# Patient Record
Sex: Female | Born: 1980 | Race: Black or African American | Hispanic: No | Marital: Married | Smoking: Former smoker
Health system: Southern US, Community
[De-identification: ages and names within clinical notes are randomized; demographics above are authoritative.]

## PROBLEM LIST (undated history)

## (undated) ENCOUNTER — Inpatient Hospital Stay (HOSPITAL_COMMUNITY): Payer: Self-pay

## (undated) DIAGNOSIS — B009 Herpesviral infection, unspecified: Secondary | ICD-10-CM

## (undated) DIAGNOSIS — I10 Essential (primary) hypertension: Secondary | ICD-10-CM

## (undated) DIAGNOSIS — E119 Type 2 diabetes mellitus without complications: Secondary | ICD-10-CM

## (undated) DIAGNOSIS — B379 Candidiasis, unspecified: Secondary | ICD-10-CM

## (undated) DIAGNOSIS — E611 Iron deficiency: Secondary | ICD-10-CM

## (undated) HISTORY — DX: Essential (primary) hypertension: I10

## (undated) HISTORY — DX: Iron deficiency: E61.1

## (undated) HISTORY — PX: WISDOM TOOTH EXTRACTION: SHX21

## (undated) HISTORY — DX: Herpesviral infection, unspecified: B00.9

## (undated) HISTORY — DX: Type 2 diabetes mellitus without complications: E11.9

## (undated) HISTORY — DX: Candidiasis, unspecified: B37.9

---

## 1999-08-20 ENCOUNTER — Emergency Department (HOSPITAL_COMMUNITY): Admission: EM | Admit: 1999-08-20 | Discharge: 1999-08-20 | Payer: Self-pay | Admitting: Internal Medicine

## 2003-03-31 ENCOUNTER — Other Ambulatory Visit: Admission: RE | Admit: 2003-03-31 | Discharge: 2003-03-31 | Payer: Self-pay | Admitting: Obstetrics and Gynecology

## 2004-04-27 ENCOUNTER — Other Ambulatory Visit: Admission: RE | Admit: 2004-04-27 | Discharge: 2004-04-27 | Payer: Self-pay | Admitting: Obstetrics and Gynecology

## 2005-05-01 ENCOUNTER — Other Ambulatory Visit: Admission: RE | Admit: 2005-05-01 | Discharge: 2005-05-01 | Payer: Self-pay | Admitting: Obstetrics and Gynecology

## 2011-11-10 DIAGNOSIS — I10 Essential (primary) hypertension: Secondary | ICD-10-CM | POA: Insufficient documentation

## 2011-11-16 ENCOUNTER — Encounter: Payer: Self-pay | Admitting: Obstetrics and Gynecology

## 2011-12-04 ENCOUNTER — Encounter: Payer: Self-pay | Admitting: Obstetrics and Gynecology

## 2011-12-04 ENCOUNTER — Ambulatory Visit (INDEPENDENT_AMBULATORY_CARE_PROVIDER_SITE_OTHER): Payer: Commercial Indemnity | Admitting: Obstetrics and Gynecology

## 2011-12-04 VITALS — BP 118/72 | HR 84 | Resp 16 | Ht 61.0 in | Wt 224.0 lb

## 2011-12-04 DIAGNOSIS — Z01419 Encounter for gynecological examination (general) (routine) without abnormal findings: Secondary | ICD-10-CM

## 2011-12-04 DIAGNOSIS — E611 Iron deficiency: Secondary | ICD-10-CM | POA: Insufficient documentation

## 2011-12-04 DIAGNOSIS — B009 Herpesviral infection, unspecified: Secondary | ICD-10-CM | POA: Insufficient documentation

## 2011-12-04 DIAGNOSIS — Z124 Encounter for screening for malignant neoplasm of cervix: Secondary | ICD-10-CM

## 2011-12-04 NOTE — Progress Notes (Signed)
ANNUAL GYNECOLOGIC EXAMINATION   Roberta Bell is a 31 y.o. female, No obstetric history on file., who presents for an annual exam. The patient has not been seen in our office in 3 years.  She has a history of hidradenitis.  She is not using contraception.  She and her husband are now on good terms. History of herpes virus 1.  No lesions.  Prior Hysterectomy: No    History   Social History  . Marital Status: Single    Spouse Name: N/A    Number of Children: N/A  . Years of Education: N/A   Social History Main Topics  . Smoking status: Former Games developer  . Smokeless tobacco: Never Used  . Alcohol Use: No  . Drug Use: No  . Sexually Active: None   Other Topics Concern  . None   Social History Narrative  . None    Menstrual cycle:   LMP: Patient's last menstrual period was 11/12/2011.             The following portions of the patient's history were reviewed and updated as appropriate: allergies, current medications, past family history, past medical history, past social history, past surgical history and problem list.  Review of Systems Pertinent items are noted in HPI. Breast:Negative for breast lump,nipple discharge or nipple retraction Gastrointestinal: Negative for abdominal pain, change in bowel habits or rectal bleeding Urinary:negative   Objective:    BP 118/72  Pulse 84  Resp 16  Ht 5\' 1"  (1.549 m)  Wt 224 lb (101.606 kg)  BMI 42.32 kg/m2  LMP 11/12/2011    Weight:  Wt Readings from Last 1 Encounters:  12/04/11 224 lb (101.606 kg)          BMI: Body mass index is 42.32 kg/(m^2).  General Appearance: Alert, appropriate appearance for age. No acute distress HEENT: Grossly normal Neck / Thyroid: Supple, no masses, nodes or enlargement Lungs: clear to auscultation bilaterally Back: No CVA tenderness Breast Exam: No masses or nodes.No dimpling, nipple retraction or discharge. Cardiovascular: Regular rate and rhythm. S1, S2, no murmur Gastrointestinal: Soft,  non-tender, no masses or organomegaly  ++++++++++++++++++++++++++++++++++++++++++++++++++++++++  Pelvic Exam: External genitalia: normal general appearance Vaginal: normal without tenderness, induration or masses. Relaxation: No Cervix: normal appearance Adnexa: normal bimanual exam Uterus: normal size, shape, and consistency Rectovaginal: normal rectal, no masses  ++++++++++++++++++++++++++++++++++++++++++++++++++++++++  Lymphatic Exam: Non-palpable nodes in neck, clavicular, axillary, or inguinal regions Neurologic: Normal speech, no tremor  Psychiatric: Alert and oriented, appropriate affect.  Assessment:    Normal gyn exam   Overweight or obese: Yes   Pelvic relaxation: No  Hidradenitis.  May want to get pregnant in the next one or 2 years.  Herpes virus 1   Plan:    pap smear return annually or prn Contraception:no method    Medications prescribed: none  STD screen request: No   The updated Pap smear screening guidelines were discussed with the patient. The patient requested that I obtain a Pap smear: Yes.  Kegel exercises discussed: No.  Proper diet and regular exercise were reviewed.  Annual mammograms recommended starting at age 51. Proper breast care was discussed.  Screening colonoscopy is recommended beginning at age 64.  Regular health maintenance was reviewed.  Sleep hygiene was discussed.  Adequate calcium and vitamin D intake was emphasized.  Leonard Schwartz M.D.    Regular Periods: no Mammogram: no  Monthly Breast Ex.: yes Exercise: yes  Tetanus < 10 years: no Seatbelts: yes  NI. Bladder  Functn.: yes Abuse at home: no  Daily BM's: yes Stressful Work: yes  Healthy Diet: yes Sigmoid-Colonoscopy: "N/A"  Calcium: no Medical problems this year: pt was placed on B/P medication.    LAST PAP:06/10/2008 "WNL"  Contraception: None  Mammogram:  Never  PCP: Posey Pronto, MD   PMH: None  FMH: None   Last Bone Scan:  Never

## 2011-12-05 LAB — PAP IG W/ RFLX HPV ASCU

## 2013-11-18 ENCOUNTER — Other Ambulatory Visit (HOSPITAL_COMMUNITY): Payer: Self-pay | Admitting: Obstetrics and Gynecology

## 2013-11-18 DIAGNOSIS — N978 Female infertility of other origin: Secondary | ICD-10-CM

## 2013-11-21 ENCOUNTER — Ambulatory Visit (HOSPITAL_COMMUNITY)
Admission: RE | Admit: 2013-11-21 | Discharge: 2013-11-21 | Disposition: A | Discharge status: Home / Self Care | Payer: 59 | Source: Ambulatory Visit | Attending: Obstetrics and Gynecology | Admitting: Obstetrics and Gynecology

## 2013-11-21 DIAGNOSIS — N978 Female infertility of other origin: Secondary | ICD-10-CM

## 2013-11-21 DIAGNOSIS — N979 Female infertility, unspecified: Secondary | ICD-10-CM | POA: Insufficient documentation

## 2013-11-21 MED ORDER — IOHEXOL 300 MG/ML  SOLN
20.0000 mL | Freq: Once | INTRAMUSCULAR | Status: AC | PRN
  Administered 2013-11-21: 20 mL

## 2014-10-29 ENCOUNTER — Inpatient Hospital Stay (HOSPITAL_COMMUNITY): Payer: BLUE CROSS/BLUE SHIELD

## 2014-10-29 ENCOUNTER — Inpatient Hospital Stay (HOSPITAL_COMMUNITY)
Admission: AD | Admit: 2014-10-29 | Discharge: 2014-10-29 | Disposition: A | Discharge status: Home / Self Care | Payer: BLUE CROSS/BLUE SHIELD | Source: Ambulatory Visit | Attending: Obstetrics and Gynecology | Admitting: Obstetrics and Gynecology

## 2014-10-29 DIAGNOSIS — Z3A01 Less than 8 weeks gestation of pregnancy: Secondary | ICD-10-CM | POA: Diagnosis not present

## 2014-10-29 DIAGNOSIS — O209 Hemorrhage in early pregnancy, unspecified: Secondary | ICD-10-CM | POA: Diagnosis not present

## 2014-10-29 DIAGNOSIS — Z87891 Personal history of nicotine dependence: Secondary | ICD-10-CM | POA: Diagnosis not present

## 2014-10-29 LAB — URINALYSIS, ROUTINE W REFLEX MICROSCOPIC
Bilirubin Urine: NEGATIVE
Glucose, UA: NEGATIVE mg/dL
Ketones, ur: NEGATIVE mg/dL
Nitrite: NEGATIVE
Protein, ur: NEGATIVE mg/dL
Specific Gravity, Urine: 1.01 (ref 1.005–1.030)
Urobilinogen, UA: 0.2 mg/dL (ref 0.0–1.0)
pH: 7.5 (ref 5.0–8.0)

## 2014-10-29 LAB — CBC WITH DIFFERENTIAL/PLATELET
Basophils Absolute: 0 10*3/uL (ref 0.0–0.1)
Basophils Relative: 0 % (ref 0–1)
EOS PCT: 2 % (ref 0–5)
Eosinophils Absolute: 0.3 10*3/uL (ref 0.0–0.7)
HEMATOCRIT: 36.6 % (ref 36.0–46.0)
Hemoglobin: 11.3 g/dL — ABNORMAL LOW (ref 12.0–15.0)
LYMPHS ABS: 2 10*3/uL (ref 0.7–4.0)
LYMPHS PCT: 19 % (ref 12–46)
MCH: 22.1 pg — ABNORMAL LOW (ref 26.0–34.0)
MCHC: 30.9 g/dL (ref 30.0–36.0)
MCV: 71.6 fL — ABNORMAL LOW (ref 78.0–100.0)
MONO ABS: 0.6 10*3/uL (ref 0.1–1.0)
Monocytes Relative: 6 % (ref 3–12)
NEUTROS ABS: 7.6 10*3/uL (ref 1.7–7.7)
Neutrophils Relative %: 73 % (ref 43–77)
Platelets: 261 10*3/uL (ref 150–400)
RBC: 5.11 MIL/uL (ref 3.87–5.11)
RDW: 15.8 % — ABNORMAL HIGH (ref 11.5–15.5)
WBC: 10.5 10*3/uL (ref 4.0–10.5)

## 2014-10-29 LAB — URINE MICROSCOPIC-ADD ON

## 2014-10-29 LAB — HCG, QUANTITATIVE, PREGNANCY: hCG, Beta Chain, Quant, S: 1284 m[IU]/mL — ABNORMAL HIGH (ref <5)

## 2014-10-29 LAB — ABO/RH: ABO/RH(D): A POS

## 2014-10-29 LAB — POCT PREGNANCY, URINE: PREG TEST UR: POSITIVE — AB

## 2014-10-29 NOTE — MAU Note (Signed)
Pt presents complaining of vaginal bleeding and lower abdominal cramping like period cramping. Pt states she is here for an ultrasound. Infertility pt. Bleeding is less now and only when wiping.

## 2014-10-29 NOTE — MAU Provider Note (Signed)
Roberta Bell is a 34 y.o. G1P0 at 5.0 weeks a GN infertility pt with confirmed pregnancy called today with VB.  Quant on 10/27/14 1289.0   History     Patient Active Problem List   Diagnosis Date Noted  . HSV-1 infection   . Low iron     Chief Complaint  Patient presents with  . Vaginal Bleeding   HPI  OB History    No data available      Past Medical History  Diagnosis Date  . HSV-1 infection   . Yeast infection   . Hypertension   . Low iron     Past Surgical History  Procedure Laterality Date  . Wisdom tooth extraction      Family History  Problem Relation Age of Onset  . Diabetes Mother   . Hypertension Father   . Diabetes Father     Social History  Substance Use Topics  . Smoking status: Former Games developer  . Smokeless tobacco: Never Used  . Alcohol Use: No    Allergies: Not on File  Prescriptions prior to admission  Medication Sig Dispense Refill Last Dose  . lisinopril (PRINIVIL,ZESTRIL) 10 MG tablet Take 10 mg by mouth daily.   Taking  . valACYclovir (VALTREX) 500 MG tablet Take 500 mg by mouth 2 (two) times daily.   Taking    ROS See HPI above, all other systems are negative  Physical Exam   Last menstrual period 09/20/2014.  Physical Exam Ext:  WNL ABD: Soft, non tender to palpation, no rebound or guarding SVE: deferred    ED Course  Assessment: VB   Plan: CBC, Quant, [place] for viability  Jayme Mednick, CNM, MSN 10/29/2014. 12:50 PM

## 2014-10-29 NOTE — Discharge Instructions (Signed)

## 2014-10-29 NOTE — H&P (Signed)
Admission History and Physical Exam for a Gynecology Patient  [name] is a 34 y.o. female, No obstetric history on file., who presents for evaluation of first trimester bleeding. She has been followed at the [facility] and Gynecology division of [facility]. The patient is 5.[redacted] weeks pregnant. She conceived this pregnancy with the use of clomiphene. She reports having vaginal bleeding this morning. She denies pain. Her quantitative hCG on 10/27/2014 was 1289.  OB History    No data available      Past Medical History  Diagnosis Date  . HSV-1 infection   . Yeast infection   . Hypertension   . Low iron     Prescriptions prior to admission  Medication Sig Dispense Refill Last Dose  . hydrochlorothiazide (HYDRODIURIL) 25 MG tablet Take 25 mg by mouth at bedtime.   10/28/2014 at Unknown time  . methyldopa (ALDOMET) 250 MG tablet Take 250 mg by mouth 2 (two) times daily.   10/29/2014 at Unknown time  . valACYclovir (VALTREX) 500 MG tablet Take 500 mg by mouth 2 (two) times daily.   Taking    Past Surgical History  Procedure Laterality Date  . Wisdom tooth extraction      No Known Allergies  Family History: family history includes Diabetes in her father and mother; Hypertension in her father.  Social History:  reports that she has quit smoking. She has never used smokeless tobacco. She reports that she does not drink alcohol or use illicit drugs.  Review of systems: See HPI.  Admission Physical Exam:    BMI = 44.  Last menstrual period 09/20/2014.  HEENT:                 Within normal limits Chest:                   Clear Heart:                    Regular rate and rhythm Breasts:                No masses, skin changes, bleeding, or discharge present Abdomen:             Nontender, no masses Extremities:          Grossly normal Neurologic exam: Grossly normal  Pelvic exam:  External genitalia: normal general appearance Vaginal:  Small amount of dark blood in the vaginal vault Cervix: Closed, nontender Adnexa: normal bimanual exam Uterus: Normal size, nontender (difficult to evaluate because of increased BMI)   Results for orders placed or performed during the hospital encounter of 10/29/14 (from the past 24 hour(s))  Urinalysis, Routine w reflex microscopic (not at [facility])     Status: Abnormal   Collection Time: 10/29/14 12:30 PM  Result Value Ref Range   Color, Urine YELLOW YELLOW   APPearance CLEAR CLEAR   Specific Gravity, Urine 1.010 1.005 - 1.030   pH 7.5 5.0 - 8.0   Glucose, UA NEGATIVE NEGATIVE mg/dL   Hgb urine dipstick LARGE (A) NEGATIVE   Bilirubin Urine NEGATIVE NEGATIVE   Ketones, ur NEGATIVE NEGATIVE mg/dL   Protein, ur NEGATIVE NEGATIVE mg/dL   Urobilinogen, UA 0.2 0.0 - 1.0 mg/dL   Nitrite NEGATIVE NEGATIVE   Leukocytes, UA SMALL (A) NEGATIVE  Urine microscopic-add on     Status: Abnormal   Collection Time: 10/29/14 12:30 PM  Result Value Ref Range   Squamous Epithelial / LPF FEW (A)  RARE   WBC, UA 0-2 <3 WBC/hpf  CBC with Differential/Platelet     Status: Abnormal   Collection Time: 10/29/14  1:07 PM  Result Value Ref Range   WBC 10.5 4.0 - 10.5 K/uL   RBC 5.11 3.87 - 5.11 MIL/uL   Hemoglobin 11.3 (L) 12.0 - 15.0 g/dL   HCT 19.1 47.8 - 29.5 %   MCV 71.6 (L) 78.0 - 100.0 fL   MCH 22.1 (L) 26.0 - 34.0 pg   MCHC 30.9 30.0 - 36.0 g/dL   RDW 62.1 (H) 30.8 - 65.7 %   Platelets 261 150 - 400 K/uL   Neutrophils Relative % 73 43 - 77 %   Neutro Abs 7.6 1.7 - 7.7 K/uL   Lymphocytes Relative 19 12 - 46 %   Lymphs Abs 2.0 0.7 - 4.0 K/uL   Monocytes Relative 6 3 - 12 %   Monocytes Absolute 0.6 0.1 - 1.0 K/uL   Eosinophils Relative 2 0 - 5 %   Eosinophils Absolute 0.3 0.0 - 0.7 K/uL   Basophils Relative 0 0 - 1 %   Basophils Absolute 0.0 0.0 - 0.1 K/uL  hCG, quantitative, pregnancy     Status: Abnormal   Collection Time: 10/29/14  1:07 PM  Result Value Ref Range   hCG, Beta Chain, Quant, S  1284 (H) <5 mIU/mL  Pregnancy, urine POC     Status: Abnormal   Collection Time: 10/29/14  1:36 PM  Result Value Ref Range   Preg Test, Ur POSITIVE (A) NEGATIVE   Ultrasound: No intra-uterine pregnancy is visualized. The endometrial complex measures 11 mm. The left ovary is within normal limits. Fibroids are noted with a 6.0 cm intramural posterior fibroid being dominant. The right ovary and adnexa are not visualized. No free fluid is seen in the cul-de-sac.  Assessment:  5 week 4 day gestation  First trimester bleeding  Fibroid uterus  Hypertension  Infertility  Obesity  Anemia  Plan:  Management of first trimester bleeding was reviewed. Options discussed. Risks and and benefits outlined.  The patient will repeat her quantitative hCG on 10/31/2014. She will remain nothing by mouth because she understands that a D&C may be required or even laparoscopy if an ectopic pregnancy is suspected.  Ectopic precautions were given. The patient will call if her bleeding increases or she has severe pain.  Blood type and Rh are pending.  GC and chlamydia was sent.   Janine Limbo 10/29/2014

## 2014-10-30 LAB — GC/CHLAMYDIA PROBE AMP (~~LOC~~) NOT AT ARMC
Chlamydia: NEGATIVE
Neisseria Gonorrhea: NEGATIVE

## 2014-10-31 ENCOUNTER — Encounter (HOSPITAL_COMMUNITY): Payer: Self-pay | Admitting: *Deleted

## 2014-10-31 ENCOUNTER — Inpatient Hospital Stay (HOSPITAL_COMMUNITY)
Admission: AD | Admit: 2014-10-31 | Discharge: 2014-10-31 | Disposition: A | Discharge status: Home / Self Care | Payer: BLUE CROSS/BLUE SHIELD | Source: Ambulatory Visit | Attending: Obstetrics and Gynecology | Admitting: Obstetrics and Gynecology

## 2014-10-31 DIAGNOSIS — Z87891 Personal history of nicotine dependence: Secondary | ICD-10-CM | POA: Diagnosis not present

## 2014-10-31 DIAGNOSIS — O10919 Unspecified pre-existing hypertension complicating pregnancy, unspecified trimester: Secondary | ICD-10-CM | POA: Insufficient documentation

## 2014-10-31 DIAGNOSIS — Z3A00 Weeks of gestation of pregnancy not specified: Secondary | ICD-10-CM | POA: Insufficient documentation

## 2014-10-31 DIAGNOSIS — O200 Threatened abortion: Secondary | ICD-10-CM | POA: Insufficient documentation

## 2014-10-31 DIAGNOSIS — O039 Complete or unspecified spontaneous abortion without complication: Secondary | ICD-10-CM

## 2014-10-31 LAB — HCG, QUANTITATIVE, PREGNANCY: hCG, Beta Chain, Quant, S: 1247 m[IU]/mL — ABNORMAL HIGH (ref <5)

## 2014-10-31 NOTE — Progress Notes (Signed)
Notified lab results are back, will come see pt.

## 2014-10-31 NOTE — MAU Provider Note (Addendum)
  History   34 yo G1P0 here for f/u [facility] s/p evaluation in MAU on 8/11 by Dr. Stefano Gaul for vaginal bleeding at 5 4/[redacted] weeks gestation--QHCG that day 1284, normal Hgb.  US showed no IUP, 6 cm intramural fibroid, normal left ovary, right ovary unable to be visualized.  Patient conceived on Clomid.  Denies pain.  Still has small amount spotting.    Patient Active Problem List   Diagnosis Date Noted  . Miscarriage 10/31/14 10/31/2014  . First trimester bleeding 10/29/2014  . HSV-1 infection   . Low iron     Chief Complaint  Patient presents with  . Follow-up   HPI:  As above  OB History    Gravida Para Term Preterm AB TAB SAB Ectopic Multiple Living   1               Past Medical History  Diagnosis Date  . HSV-1 infection   . Yeast infection   . Hypertension   . Low iron     Past Surgical History  Procedure Laterality Date  . Wisdom tooth extraction      Family History  Problem Relation Age of Onset  . Diabetes Mother   . Hypertension Father   . Diabetes Father     Social History  Substance Use Topics  . Smoking status: Former Games developer  . Smokeless tobacco: Never Used  . Alcohol Use: No    Allergies: No Known Allergies  Prescriptions prior to admission  Medication Sig Dispense Refill Last Dose  . hydrochlorothiazide (HYDRODIURIL) 25 MG tablet Take 25 mg by mouth at bedtime.   10/28/2014 at Unknown time  . methyldopa (ALDOMET) 250 MG tablet Take 250 mg by mouth 2 (two) times daily.   10/29/2014 at Unknown time  . Prenatal Vit-Fe Fumarate-FA (PRENATAL MULTIVITAMIN) TABS tablet Take 1 tablet by mouth daily at 12 noon.   10/29/2014 at Unknown time    ROS:  Small amount BRB Physical Exam   Blood pressure 128/76, pulse 89, temperature 98.7 F (37.1 C), resp. rate 18, height  (1.549 m), weight 104.327 kg (230 lb), last menstrual period 09/20/2014.    Physical Exam  In NAD PE deferred  Results for orders placed or performed during the hospital encounter of  10/31/14 (from the past 24 hour(s))  hCG, quantitative, pregnancy     Status: Abnormal   Collection Time: 10/31/14  7:15 AM  Result Value Ref Range   hCG, Beta Chain, Quant, S 1247 (H) <5 mIU/mL    ED Course  Assessment: Inevitable miscarriage A+ Chronic hypertension Conception on Clomid  Plan: Consulted with Dr. Norberto Sorenson and f/u [facility] recommended at present. Plan repeat [facility] later this week--patient will come to outpatient lab at [facility] on Thursday, 11/04/14, in the evening for STAT QHCG. (unable to come on Wed due to work schedule). Will keep appt scheduled for Friday, 11/05/14 at [facility]--will have provider see patient for f/u on [facility] and status. Further options for management of SAB reviewed, including continuing observation until [facility] < 5, medical management with [employer], or D&E. Support to patient and husband for loss.   Nigel Bridgeman CNM, MSN 10/31/2014 10:10 AM

## 2014-10-31 NOTE — MAU Note (Signed)
Here for f/u BHCG. Denies any pain. Bright red blood on tissue only when wipes but states that is the same as on Thurs.

## 2014-11-05 ENCOUNTER — Other Ambulatory Visit (HOSPITAL_COMMUNITY)
Admission: RE | Admit: 2014-11-05 | Discharge: 2014-11-05 | Disposition: A | Discharge status: Home / Self Care | Payer: BLUE CROSS/BLUE SHIELD | Source: Ambulatory Visit

## 2014-11-05 ENCOUNTER — Inpatient Hospital Stay (HOSPITAL_COMMUNITY)
Admission: AD | Admit: 2014-11-05 | Discharge: 2014-11-05 | Disposition: A | Discharge status: Home / Self Care | Payer: BLUE CROSS/BLUE SHIELD | Source: Ambulatory Visit | Attending: Obstetrics and Gynecology | Admitting: Obstetrics and Gynecology

## 2014-11-05 DIAGNOSIS — O200 Threatened abortion: Secondary | ICD-10-CM | POA: Diagnosis present

## 2014-11-05 DIAGNOSIS — Z3A00 Weeks of gestation of pregnancy not specified: Secondary | ICD-10-CM | POA: Diagnosis not present

## 2014-11-05 LAB — HCG, QUANTITATIVE, PREGNANCY: hCG, Beta Chain, Quant, S: 686 m[IU]/mL — ABNORMAL HIGH (ref <5)

## 2014-11-19 ENCOUNTER — Inpatient Hospital Stay (HOSPITAL_COMMUNITY)
Admission: AD | Admit: 2014-11-19 | Discharge: 2014-11-19 | Disposition: A | Discharge status: Home / Self Care | Payer: BLUE CROSS/BLUE SHIELD | Source: Ambulatory Visit | Attending: Obstetrics and Gynecology | Admitting: Obstetrics and Gynecology

## 2014-11-19 DIAGNOSIS — Z87891 Personal history of nicotine dependence: Secondary | ICD-10-CM | POA: Insufficient documentation

## 2014-11-19 DIAGNOSIS — O039 Complete or unspecified spontaneous abortion without complication: Secondary | ICD-10-CM | POA: Insufficient documentation

## 2014-11-19 DIAGNOSIS — Z3A00 Weeks of gestation of pregnancy not specified: Secondary | ICD-10-CM | POA: Insufficient documentation

## 2014-11-19 LAB — CBC WITH DIFFERENTIAL/PLATELET
BASOS ABS: 0 10*3/uL (ref 0.0–0.1)
Basophils Relative: 0 % (ref 0–1)
Eosinophils Absolute: 0.2 10*3/uL (ref 0.0–0.7)
Eosinophils Relative: 2 % (ref 0–5)
HEMATOCRIT: 35 % — AB (ref 36.0–46.0)
Hemoglobin: 10.7 g/dL — ABNORMAL LOW (ref 12.0–15.0)
LYMPHS PCT: 21 % (ref 12–46)
Lymphs Abs: 1.9 10*3/uL (ref 0.7–4.0)
MCH: 21.9 pg — AB (ref 26.0–34.0)
MCHC: 30.6 g/dL (ref 30.0–36.0)
MCV: 71.7 fL — AB (ref 78.0–100.0)
MONO ABS: 0.5 10*3/uL (ref 0.1–1.0)
Monocytes Relative: 6 % (ref 3–12)
NEUTROS ABS: 6.3 10*3/uL (ref 1.7–7.7)
Neutrophils Relative %: 71 % (ref 43–77)
Platelets: 225 10*3/uL (ref 150–400)
RBC: 4.88 MIL/uL (ref 3.87–5.11)
RDW: 15.4 % (ref 11.5–15.5)
WBC: 8.9 10*3/uL (ref 4.0–10.5)

## 2014-11-19 LAB — AST: AST: 43 U/L — ABNORMAL HIGH (ref 15–41)

## 2014-11-19 LAB — CREATININE, SERUM
CREATININE: 0.67 mg/dL (ref 0.44–1.00)
GFR calc Af Amer: >60 mL/min (ref >60)

## 2014-11-19 LAB — BUN: BUN: 12 mg/dL (ref 6–20)

## 2014-11-19 MED ORDER — METHOTREXATE INJECTION FOR WOMEN'S HOSPITAL
50.0000 mg/m2 | Freq: Once | INTRAMUSCULAR | Status: AC
  Administered 2014-11-19: 110 mg via INTRAMUSCULAR
  Filled 2014-11-19: qty 2.2

## 2014-11-19 NOTE — MAU Note (Signed)
Pt sent in for an"injection for her miscarriage.

## 2014-11-19 NOTE — MAU Provider Note (Signed)
Admission History and Physical Exam for a Gynecology Patient  Ms. Sarajane Fambrough is a 34 y.o. female, G1P0, who presents for methotrexate because of a nonviable first trimester pregnancy. The patient has had multiple quantitative hCG values.  10/27/2014  1289; 10/31/2014  1247; 11/10/2014  743; and 11/18/2014  1042.    An ultrasound shows a fibroid uterus but no definite intrauterine gestation. No adnexal masses appreciated. The patient is Rh+. She has been followed at the [facility] and Gynecology division of [facility].  OB History    Gravida Para Term Preterm AB TAB SAB Ectopic Multiple Living   1               Past Medical History  Diagnosis Date  . HSV-1 infection   . Yeast infection   . Hypertension   . Low iron     Prescriptions prior to admission  Medication Sig Dispense Refill Last Dose  . hydrochlorothiazide (HYDRODIURIL) 25 MG tablet Take 25 mg by mouth at bedtime.   11/18/2014 at Unknown time  . methyldopa (ALDOMET) 250 MG tablet Take 250 mg by mouth 2 (two) times daily.   11/19/2014 at Unknown time  . Prenatal Vit-Fe Fumarate-FA (PRENATAL MULTIVITAMIN) TABS tablet Take 1 tablet by mouth daily at 12 noon.   Past Week at Unknown time    Past Surgical History  Procedure Laterality Date  . Wisdom tooth extraction      No Known Allergies  Family History: family history includes Diabetes in her father and mother; Hypertension in her father.  Social History:  reports that she has quit smoking. She has never used smokeless tobacco. She reports that she does not drink alcohol or use illicit drugs.  Review of systems: See HPI.  Admission Physical Exam:    Body mass index is 44.58 kg/(m^2).  Blood pressure 116/70, pulse 90, temperature 99.1 F (37.3 C), resp. rate 18, height  (1.549 m), weight 235 lb 12.8 oz (106.958 kg), last menstrual period 09/20/2014.  HEENT:                 Within normal limits Chest:                    Clear Heart:                    Regular rate and rhythm Breasts:                No masses, skin changes, bleeding, or discharge present Abdomen:             Nontender, no masses Extremities:          Grossly normal Neurologic exam: Grossly normal  Pelvic exam:  Exam deferred.  Assessment:  Nonviable first trimester gestation  Abnormal hCG values  Plan:  Methotrexate per protocol.  Repeat quantitative hCG on day 4 and day 7.  Ectopic precautions given.   Janine Limbo 11/19/2014

## 2014-11-23 ENCOUNTER — Other Ambulatory Visit: Payer: Self-pay | Admitting: Obstetrics and Gynecology

## 2014-11-23 ENCOUNTER — Inpatient Hospital Stay (HOSPITAL_COMMUNITY)
Admission: AD | Admit: 2014-11-23 | Discharge: 2014-11-23 | Disposition: A | Discharge status: Home / Self Care | Payer: BLUE CROSS/BLUE SHIELD | Source: Ambulatory Visit | Attending: Obstetrics and Gynecology | Admitting: Obstetrics and Gynecology

## 2014-11-23 DIAGNOSIS — O009 Ectopic pregnancy, unspecified: Secondary | ICD-10-CM | POA: Diagnosis present

## 2014-11-23 LAB — HCG, QUANTITATIVE, PREGNANCY: HCG, BETA CHAIN, QUANT, S: 1169 m[IU]/mL — AB (ref <5)

## 2014-11-23 NOTE — MAU Provider Note (Signed)
Here for day 4 repeat QHCG from MTX dose on 11/19/14. Small amount spotting, no pain.  Has difficulty getting to office during office hrs.  QHCGs: 10/27/2014  1289 10/29/2014  1284 10/31/2014  1247 11/05/14    686 11/10/2014    743 11/18/2014  1042.  RECEIVED MTX 11/19/14 DAY 4 11/23/14 1169  [place] on 10/29/14: Ultrasound: No intra-uterine pregnancy is visualized. The endometrial complex measures 11 mm. The left ovary is within normal limits. Fibroids are noted with a 6.0 cm intramural posterior fibroid being dominant. The right ovary and adnexa are not visualized. No free fluid is seen in the cul-de-sac.   Will return to MAU on day 7 (11/26/14) for repeat STAT QHCG, to be called to [facility] CNM when available.  Patient to expect a call that night or the next am. [facility] order placed in "sign and hold". Patient may leave hospital after lab draw.  Phone number [phone]. She understands she may have to return for repeat MTX if results do not decrease by at least 15%. Ectopic precautions reviewed.  Nigel Bridgeman, CNM 11/23/14 3:30p

## 2014-11-23 NOTE — MAU Note (Signed)
Doing ok pain wise, had cramping a couple days ago.  Is bleeding, a little heavier than her normal cycle.

## 2014-11-23 NOTE — MAU Note (Signed)
Methotrexate follow up, day 4

## 2014-11-26 ENCOUNTER — Inpatient Hospital Stay (HOSPITAL_COMMUNITY)
Admission: AD | Admit: 2014-11-26 | Discharge: 2014-11-26 | Discharge status: Absent without leave | Payer: BLUE CROSS/BLUE SHIELD | Source: Home / Self Care | Attending: Obstetrics and Gynecology | Admitting: Obstetrics and Gynecology

## 2014-11-26 ENCOUNTER — Other Ambulatory Visit (HOSPITAL_COMMUNITY)
Admission: RE | Admit: 2014-11-26 | Discharge: 2014-11-26 | Disposition: A | Discharge status: Home / Self Care | Payer: BLUE CROSS/BLUE SHIELD | Source: Ambulatory Visit | Attending: Obstetrics and Gynecology | Admitting: Obstetrics and Gynecology

## 2014-11-26 DIAGNOSIS — O009 Ectopic pregnancy, unspecified: Secondary | ICD-10-CM | POA: Diagnosis present

## 2014-11-26 LAB — HCG, QUANTITATIVE, PREGNANCY: hCG, Beta Chain, Quant, S: 878 m[IU]/mL — ABNORMAL HIGH (ref <5)

## 2015-04-02 ENCOUNTER — Other Ambulatory Visit (HOSPITAL_COMMUNITY): Payer: Self-pay | Admitting: Obstetrics and Gynecology

## 2015-04-02 ENCOUNTER — Other Ambulatory Visit: Payer: Self-pay | Admitting: Obstetrics and Gynecology

## 2015-04-02 DIAGNOSIS — N979 Female infertility, unspecified: Secondary | ICD-10-CM

## 2015-04-07 ENCOUNTER — Ambulatory Visit (HOSPITAL_COMMUNITY)
Admission: RE | Admit: 2015-04-07 | Discharge: 2015-04-07 | Disposition: A | Discharge status: Home / Self Care | Payer: BLUE CROSS/BLUE SHIELD | Source: Ambulatory Visit | Attending: Obstetrics and Gynecology | Admitting: Obstetrics and Gynecology

## 2015-04-07 ENCOUNTER — Encounter (HOSPITAL_COMMUNITY): Payer: Self-pay

## 2015-04-07 DIAGNOSIS — N979 Female infertility, unspecified: Secondary | ICD-10-CM

## 2015-04-07 MED ORDER — IOHEXOL 300 MG/ML  SOLN
30.0000 mL | Freq: Once | INTRAMUSCULAR | Status: AC | PRN
  Administered 2015-04-07: 30 mL

## 2015-06-25 DIAGNOSIS — L309 Dermatitis, unspecified: Secondary | ICD-10-CM | POA: Diagnosis not present

## 2015-07-06 DIAGNOSIS — N979 Female infertility, unspecified: Secondary | ICD-10-CM | POA: Diagnosis not present

## 2015-07-13 DIAGNOSIS — N979 Female infertility, unspecified: Secondary | ICD-10-CM | POA: Diagnosis not present

## 2015-08-02 DIAGNOSIS — N979 Female infertility, unspecified: Secondary | ICD-10-CM | POA: Diagnosis not present

## 2015-08-10 DIAGNOSIS — N979 Female infertility, unspecified: Secondary | ICD-10-CM | POA: Diagnosis not present

## 2015-08-10 DIAGNOSIS — D259 Leiomyoma of uterus, unspecified: Secondary | ICD-10-CM | POA: Diagnosis not present

## 2015-08-30 DIAGNOSIS — N979 Female infertility, unspecified: Secondary | ICD-10-CM | POA: Diagnosis not present

## 2015-09-06 DIAGNOSIS — N979 Female infertility, unspecified: Secondary | ICD-10-CM | POA: Diagnosis not present

## 2015-09-14 DIAGNOSIS — N979 Female infertility, unspecified: Secondary | ICD-10-CM | POA: Diagnosis not present

## 2015-09-14 DIAGNOSIS — Z0000 Encounter for general adult medical examination without abnormal findings: Secondary | ICD-10-CM | POA: Diagnosis not present

## 2015-09-14 DIAGNOSIS — Z79899 Other long term (current) drug therapy: Secondary | ICD-10-CM | POA: Diagnosis not present

## 2015-09-14 DIAGNOSIS — I10 Essential (primary) hypertension: Secondary | ICD-10-CM | POA: Diagnosis not present

## 2015-09-14 DIAGNOSIS — R740 Nonspecific elevation of levels of transaminase and lactic acid dehydrogenase [LDH]: Secondary | ICD-10-CM | POA: Diagnosis not present

## 2015-09-14 DIAGNOSIS — E559 Vitamin D deficiency, unspecified: Secondary | ICD-10-CM | POA: Diagnosis not present

## 2015-09-17 ENCOUNTER — Other Ambulatory Visit: Payer: Self-pay | Admitting: Family Medicine

## 2015-09-17 DIAGNOSIS — R740 Nonspecific elevation of levels of transaminase and lactic acid dehydrogenase [LDH]: Principal | ICD-10-CM

## 2015-09-17 DIAGNOSIS — R7401 Elevation of levels of liver transaminase levels: Secondary | ICD-10-CM

## 2015-09-27 DIAGNOSIS — N979 Female infertility, unspecified: Secondary | ICD-10-CM | POA: Diagnosis not present

## 2015-09-28 ENCOUNTER — Ambulatory Visit
Admission: RE | Admit: 2015-09-28 | Discharge: 2015-09-28 | Disposition: A | Discharge status: Home / Self Care | Payer: BLUE CROSS/BLUE SHIELD | Source: Ambulatory Visit | Attending: Family Medicine | Admitting: Family Medicine

## 2015-09-28 DIAGNOSIS — R740 Nonspecific elevation of levels of transaminase and lactic acid dehydrogenase [LDH]: Principal | ICD-10-CM

## 2015-09-28 DIAGNOSIS — R7401 Elevation of levels of liver transaminase levels: Secondary | ICD-10-CM

## 2015-10-04 DIAGNOSIS — N979 Female infertility, unspecified: Secondary | ICD-10-CM | POA: Diagnosis not present

## 2015-10-05 DIAGNOSIS — R740 Nonspecific elevation of levels of transaminase and lactic acid dehydrogenase [LDH]: Secondary | ICD-10-CM | POA: Diagnosis not present

## 2015-10-25 DIAGNOSIS — N979 Female infertility, unspecified: Secondary | ICD-10-CM | POA: Diagnosis not present

## 2015-10-25 DIAGNOSIS — Z6841 Body Mass Index (BMI) 40.0 and over, adult: Secondary | ICD-10-CM | POA: Diagnosis not present

## 2015-10-25 DIAGNOSIS — R748 Abnormal levels of other serum enzymes: Secondary | ICD-10-CM | POA: Diagnosis not present

## 2015-11-25 DIAGNOSIS — N970 Female infertility associated with anovulation: Secondary | ICD-10-CM | POA: Diagnosis not present

## 2015-11-25 DIAGNOSIS — N979 Female infertility, unspecified: Secondary | ICD-10-CM | POA: Diagnosis not present

## 2015-12-21 DIAGNOSIS — N979 Female infertility, unspecified: Secondary | ICD-10-CM | POA: Diagnosis not present

## 2015-12-27 DIAGNOSIS — N979 Female infertility, unspecified: Secondary | ICD-10-CM | POA: Diagnosis not present

## 2016-01-24 DIAGNOSIS — N979 Female infertility, unspecified: Secondary | ICD-10-CM | POA: Diagnosis not present

## 2016-02-23 DIAGNOSIS — N979 Female infertility, unspecified: Secondary | ICD-10-CM | POA: Diagnosis not present

## 2016-02-23 DIAGNOSIS — L282 Other prurigo: Secondary | ICD-10-CM | POA: Diagnosis not present

## 2016-03-02 DIAGNOSIS — J069 Acute upper respiratory infection, unspecified: Secondary | ICD-10-CM | POA: Diagnosis not present

## 2016-04-11 DIAGNOSIS — Z6841 Body Mass Index (BMI) 40.0 and over, adult: Secondary | ICD-10-CM | POA: Diagnosis not present

## 2016-04-11 DIAGNOSIS — N979 Female infertility, unspecified: Secondary | ICD-10-CM | POA: Diagnosis not present

## 2016-04-11 DIAGNOSIS — N644 Mastodynia: Secondary | ICD-10-CM | POA: Diagnosis not present

## 2016-04-11 DIAGNOSIS — Z01411 Encounter for gynecological examination (general) (routine) with abnormal findings: Secondary | ICD-10-CM | POA: Diagnosis not present

## 2016-04-11 DIAGNOSIS — Z124 Encounter for screening for malignant neoplasm of cervix: Secondary | ICD-10-CM | POA: Diagnosis not present

## 2016-04-18 DIAGNOSIS — N979 Female infertility, unspecified: Secondary | ICD-10-CM | POA: Diagnosis not present

## 2016-05-23 DIAGNOSIS — N912 Amenorrhea, unspecified: Secondary | ICD-10-CM | POA: Diagnosis not present

## 2016-05-23 DIAGNOSIS — R300 Dysuria: Secondary | ICD-10-CM | POA: Diagnosis not present

## 2016-05-23 DIAGNOSIS — N979 Female infertility, unspecified: Secondary | ICD-10-CM | POA: Diagnosis not present

## 2016-06-13 DIAGNOSIS — N979 Female infertility, unspecified: Secondary | ICD-10-CM | POA: Diagnosis not present

## 2016-06-29 DIAGNOSIS — N979 Female infertility, unspecified: Secondary | ICD-10-CM | POA: Diagnosis not present

## 2016-06-29 DIAGNOSIS — N970 Female infertility associated with anovulation: Secondary | ICD-10-CM | POA: Diagnosis not present

## 2016-07-12 DIAGNOSIS — N979 Female infertility, unspecified: Secondary | ICD-10-CM | POA: Diagnosis not present

## 2016-08-09 DIAGNOSIS — N979 Female infertility, unspecified: Secondary | ICD-10-CM | POA: Diagnosis not present

## 2016-08-22 DIAGNOSIS — N979 Female infertility, unspecified: Secondary | ICD-10-CM | POA: Diagnosis not present

## 2016-10-12 DIAGNOSIS — Z79899 Other long term (current) drug therapy: Secondary | ICD-10-CM | POA: Diagnosis not present

## 2016-10-12 DIAGNOSIS — E559 Vitamin D deficiency, unspecified: Secondary | ICD-10-CM | POA: Diagnosis not present

## 2016-10-12 DIAGNOSIS — Z0000 Encounter for general adult medical examination without abnormal findings: Secondary | ICD-10-CM | POA: Diagnosis not present

## 2016-10-12 DIAGNOSIS — I10 Essential (primary) hypertension: Secondary | ICD-10-CM | POA: Diagnosis not present

## 2016-12-18 DIAGNOSIS — N979 Female infertility, unspecified: Secondary | ICD-10-CM | POA: Diagnosis not present

## 2016-12-27 DIAGNOSIS — N979 Female infertility, unspecified: Secondary | ICD-10-CM | POA: Diagnosis not present

## 2016-12-27 DIAGNOSIS — R52 Pain, unspecified: Secondary | ICD-10-CM | POA: Diagnosis not present

## 2017-01-15 DIAGNOSIS — N979 Female infertility, unspecified: Secondary | ICD-10-CM | POA: Diagnosis not present

## 2017-02-14 DIAGNOSIS — Z713 Dietary counseling and surveillance: Secondary | ICD-10-CM | POA: Diagnosis not present

## 2017-02-14 DIAGNOSIS — Z3141 Encounter for fertility testing: Secondary | ICD-10-CM | POA: Diagnosis not present

## 2017-02-14 DIAGNOSIS — Z3169 Encounter for other general counseling and advice on procreation: Secondary | ICD-10-CM | POA: Diagnosis not present

## 2017-02-14 DIAGNOSIS — Z6841 Body Mass Index (BMI) 40.0 and over, adult: Secondary | ICD-10-CM | POA: Diagnosis not present

## 2017-03-02 DIAGNOSIS — Z3141 Encounter for fertility testing: Secondary | ICD-10-CM | POA: Diagnosis not present

## 2017-03-05 DIAGNOSIS — E1165 Type 2 diabetes mellitus with hyperglycemia: Secondary | ICD-10-CM | POA: Diagnosis not present

## 2017-03-05 DIAGNOSIS — R350 Frequency of micturition: Secondary | ICD-10-CM | POA: Diagnosis not present

## 2017-03-05 DIAGNOSIS — R7301 Impaired fasting glucose: Secondary | ICD-10-CM | POA: Diagnosis not present

## 2017-03-05 DIAGNOSIS — E559 Vitamin D deficiency, unspecified: Secondary | ICD-10-CM | POA: Diagnosis not present

## 2017-03-06 DIAGNOSIS — E119 Type 2 diabetes mellitus without complications: Secondary | ICD-10-CM | POA: Diagnosis not present

## 2017-03-07 DIAGNOSIS — Z23 Encounter for immunization: Secondary | ICD-10-CM | POA: Diagnosis not present

## 2017-03-26 DIAGNOSIS — E119 Type 2 diabetes mellitus without complications: Secondary | ICD-10-CM | POA: Diagnosis not present

## 2017-03-26 DIAGNOSIS — D259 Leiomyoma of uterus, unspecified: Secondary | ICD-10-CM | POA: Diagnosis not present

## 2017-03-26 DIAGNOSIS — N979 Female infertility, unspecified: Secondary | ICD-10-CM | POA: Diagnosis not present

## 2017-03-29 ENCOUNTER — Encounter: Payer: Self-pay | Admitting: Dietician

## 2017-03-29 ENCOUNTER — Encounter: Payer: BLUE CROSS/BLUE SHIELD | Attending: Family Medicine | Admitting: Dietician

## 2017-03-29 DIAGNOSIS — Z713 Dietary counseling and surveillance: Secondary | ICD-10-CM | POA: Diagnosis not present

## 2017-03-29 DIAGNOSIS — E119 Type 2 diabetes mellitus without complications: Secondary | ICD-10-CM | POA: Diagnosis not present

## 2017-03-29 NOTE — Progress Notes (Signed)

## 2017-04-05 ENCOUNTER — Encounter: Payer: BLUE CROSS/BLUE SHIELD | Admitting: Dietician

## 2017-04-05 DIAGNOSIS — Z713 Dietary counseling and surveillance: Secondary | ICD-10-CM | POA: Diagnosis not present

## 2017-04-05 DIAGNOSIS — E119 Type 2 diabetes mellitus without complications: Secondary | ICD-10-CM | POA: Diagnosis not present

## 2017-04-05 NOTE — Progress Notes (Signed)
Patient was seen on 01/24/18 for the second of a series of three diabetes self-management courses at the [facility]. The following learning objectives were met by the patient during this class:   Describe the role of different macronutrients on glucose  Explain how carbohydrates affect blood glucose  State what foods contain the most carbohydrates  Demonstrate carbohydrate counting  Demonstrate how to read Nutrition Facts food label  Describe effects of various fats on heart health  Describe the importance of good nutrition for health and healthy eating strategies  Describe techniques for managing your shopping, cooking and meal planning  List strategies to follow meal plan when dining out  Describe the effects of alcohol on glucose and how to use it safely  Goals:  Follow Diabetes Meal Plan as instructed  Aim to spread carbs evenly throughout the day  Aim for 3 meals per day and snacks as needed Include lean protein foods to meals/snacks  Monitor glucose levels as instructed by your doctor   Follow-Up Plan:  Attend Core 3  Work towards following your personal food plan.   

## 2017-04-12 ENCOUNTER — Encounter: Payer: BLUE CROSS/BLUE SHIELD | Admitting: Dietician

## 2017-04-12 DIAGNOSIS — E119 Type 2 diabetes mellitus without complications: Secondary | ICD-10-CM

## 2017-04-12 DIAGNOSIS — Z713 Dietary counseling and surveillance: Secondary | ICD-10-CM | POA: Diagnosis not present

## 2017-04-16 NOTE — Progress Notes (Signed)
Patient was seen on 04/12/17 for the third of a series of three diabetes self-management courses at the [facility].   Roberta Bell. State the amount of activity recommended for healthy living . Describe activities suitable for individual needs . Identify ways to regularly incorporate activity into daily life . Identify barriers to activity and ways to over come these barriers  Identify diabetes medications being personally used and their primary action for lowering glucose and possible side effects . Describe role of stress on blood glucose and develop strategies to address psychosocial issues . Identify diabetes complications and ways to prevent them  Explain how to manage diabetes during illness . Evaluate success in meeting personal goal . Establish 2-3 goals that they will plan to diligently work on until they return for the  2523-month follow-up visit  Goals:   I will count my carb choices at most meals and snacks  I will be active 10 plus minutes or more 5 times a week  Your patient has identified these potential barriers to change:  Motivation Time  Your patient has identified their diabetes self-care support plan as  Family Support [employer] Diabetes Doctor, hospitalAssociation Website On-line Resources    Plan:  Attend Support Group as desired

## 2017-04-17 DIAGNOSIS — Z01411 Encounter for gynecological examination (general) (routine) with abnormal findings: Secondary | ICD-10-CM | POA: Diagnosis not present

## 2017-04-17 DIAGNOSIS — B372 Candidiasis of skin and nail: Secondary | ICD-10-CM | POA: Diagnosis not present

## 2017-04-17 DIAGNOSIS — Z124 Encounter for screening for malignant neoplasm of cervix: Secondary | ICD-10-CM | POA: Diagnosis not present

## 2017-04-17 DIAGNOSIS — R52 Pain, unspecified: Secondary | ICD-10-CM | POA: Diagnosis not present

## 2017-05-08 DIAGNOSIS — E039 Hypothyroidism, unspecified: Secondary | ICD-10-CM | POA: Diagnosis not present

## 2017-05-08 DIAGNOSIS — I10 Essential (primary) hypertension: Secondary | ICD-10-CM | POA: Diagnosis not present

## 2017-05-08 DIAGNOSIS — E1165 Type 2 diabetes mellitus with hyperglycemia: Secondary | ICD-10-CM | POA: Diagnosis not present

## 2017-05-08 DIAGNOSIS — D259 Leiomyoma of uterus, unspecified: Secondary | ICD-10-CM | POA: Diagnosis not present

## 2017-05-09 DIAGNOSIS — D219 Benign neoplasm of connective and other soft tissue, unspecified: Secondary | ICD-10-CM | POA: Diagnosis not present

## 2017-05-09 DIAGNOSIS — D252 Subserosal leiomyoma of uterus: Secondary | ICD-10-CM | POA: Diagnosis not present

## 2017-05-09 DIAGNOSIS — N939 Abnormal uterine and vaginal bleeding, unspecified: Secondary | ICD-10-CM | POA: Diagnosis not present

## 2017-05-09 DIAGNOSIS — D251 Intramural leiomyoma of uterus: Secondary | ICD-10-CM | POA: Diagnosis not present

## 2017-05-09 DIAGNOSIS — Z01818 Encounter for other preprocedural examination: Secondary | ICD-10-CM | POA: Diagnosis not present

## 2017-05-09 DIAGNOSIS — E119 Type 2 diabetes mellitus without complications: Secondary | ICD-10-CM | POA: Diagnosis not present

## 2017-05-09 DIAGNOSIS — Z6841 Body Mass Index (BMI) 40.0 and over, adult: Secondary | ICD-10-CM | POA: Diagnosis not present

## 2017-05-14 DIAGNOSIS — Z01818 Encounter for other preprocedural examination: Secondary | ICD-10-CM | POA: Diagnosis not present

## 2017-06-04 DIAGNOSIS — I10 Essential (primary) hypertension: Secondary | ICD-10-CM | POA: Diagnosis not present

## 2017-06-08 IMAGING — US US OB TRANSVAGINAL
1 series · 13 of 28 positions shown · non-contrast
Comparison: None.

CLINICAL DATA: Pregnant, vaginal bleeding, beta HCG 3559 today,
reportedly 6670 on 10/27/2014

EXAM:
OBSTETRIC <14 WK US AND TRANSVAGINAL OB US
TECHNIQUE: Both transabdominal and transvaginal ultrasound examinations were
performed for complete evaluation of the gestation as well as the
maternal uterus, adnexal regions, and pelvic cul-de-sac.
Transvaginal technique was performed to assess early pregnancy.

[Series 1: us ob follow up · 13 of 52 slices shown]
[im 2/52]
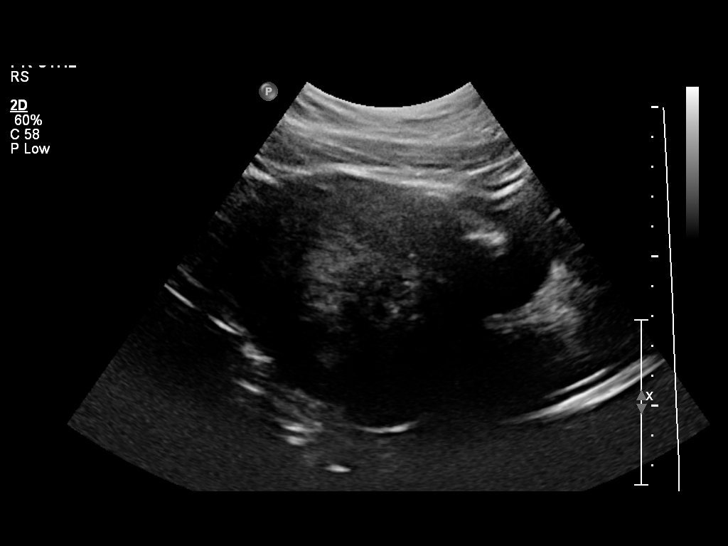
[im 6/52]
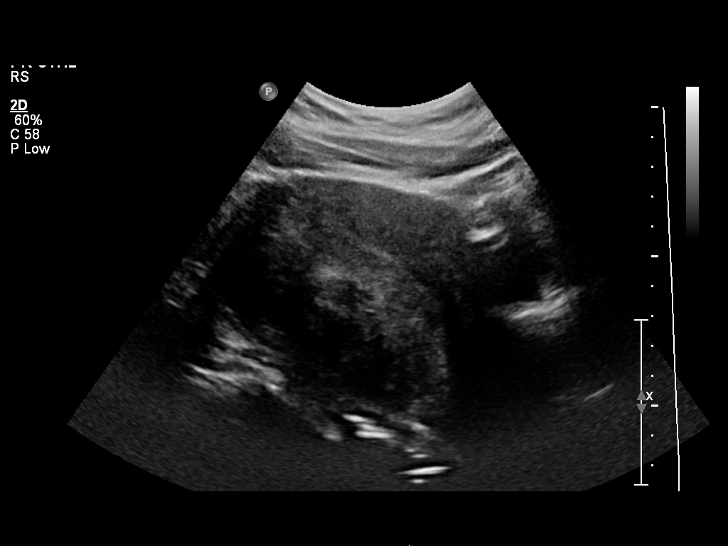
[im 10/52]
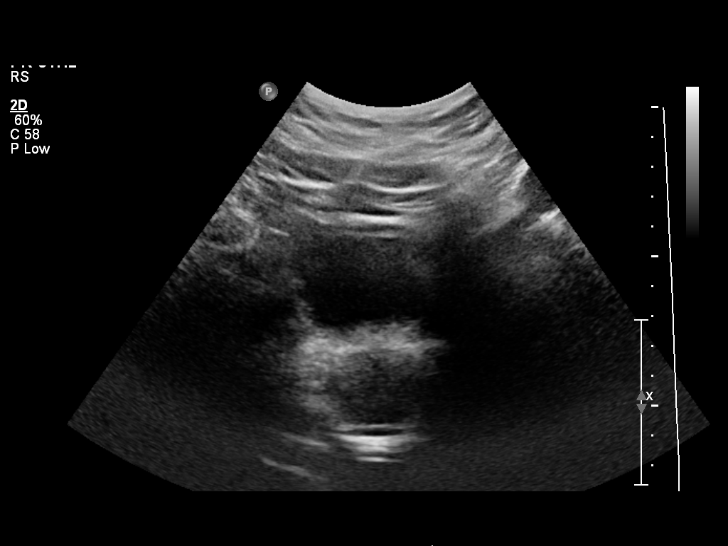
[im 14/52]
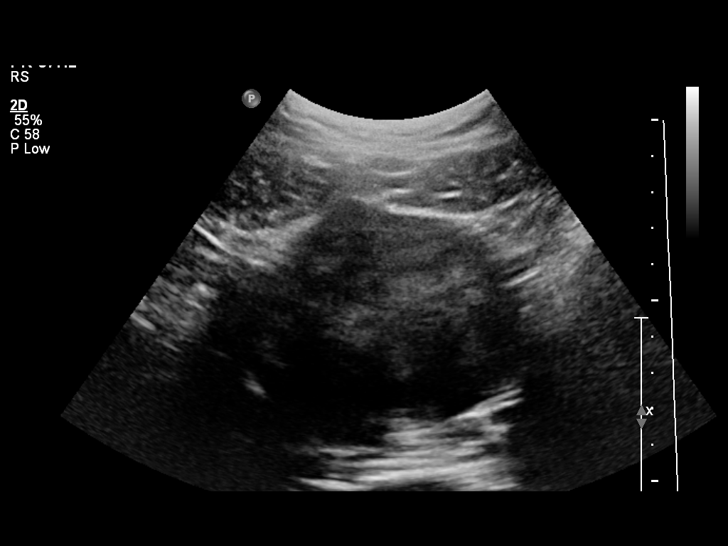
[im 18/52]
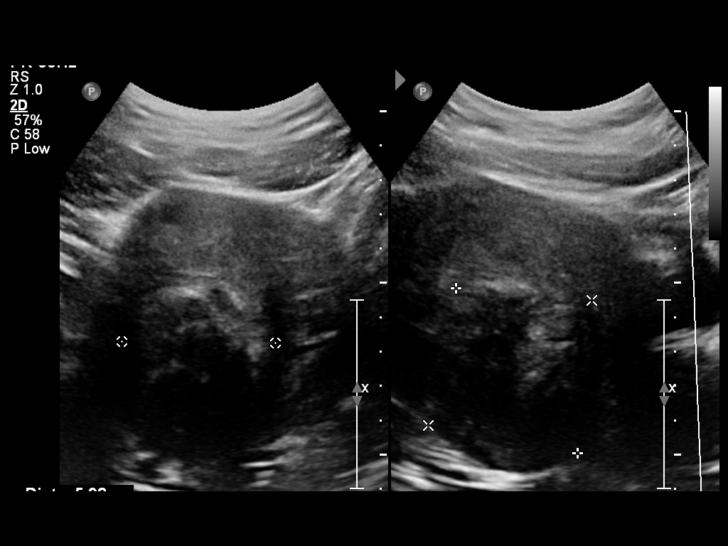
[im 21/52]
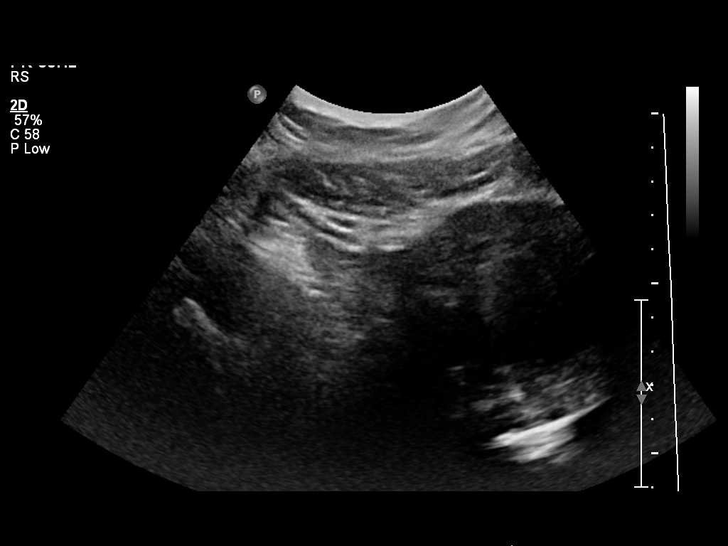
[im 27/52]
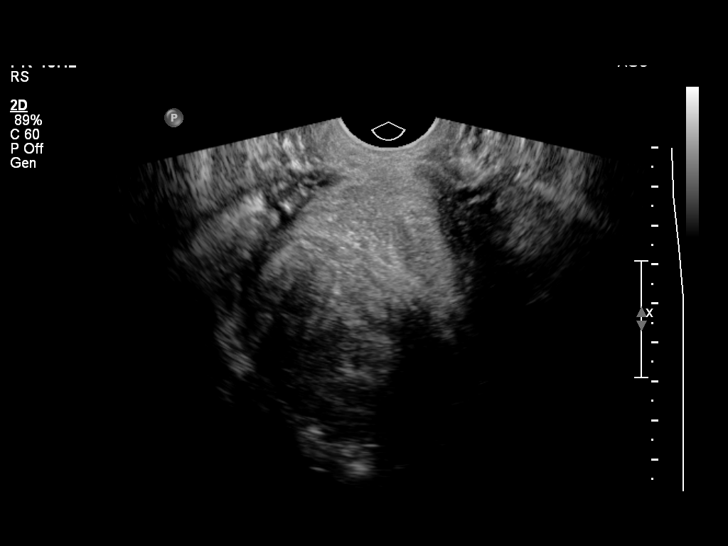
[im 31/52]
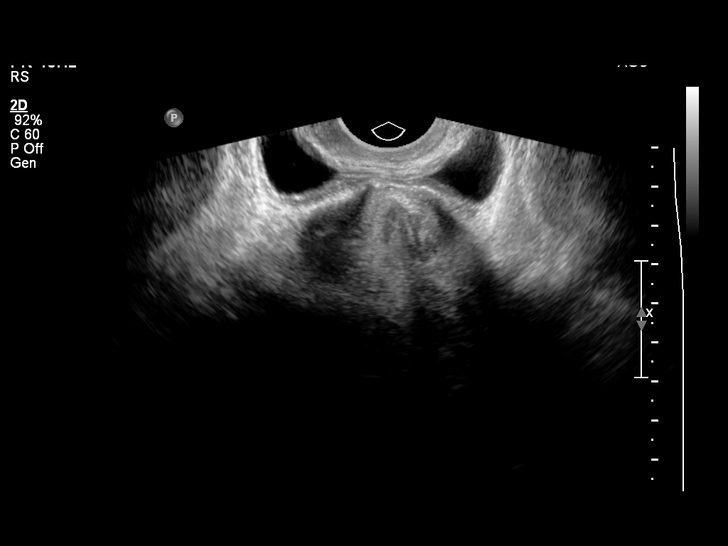
[im 35/52]
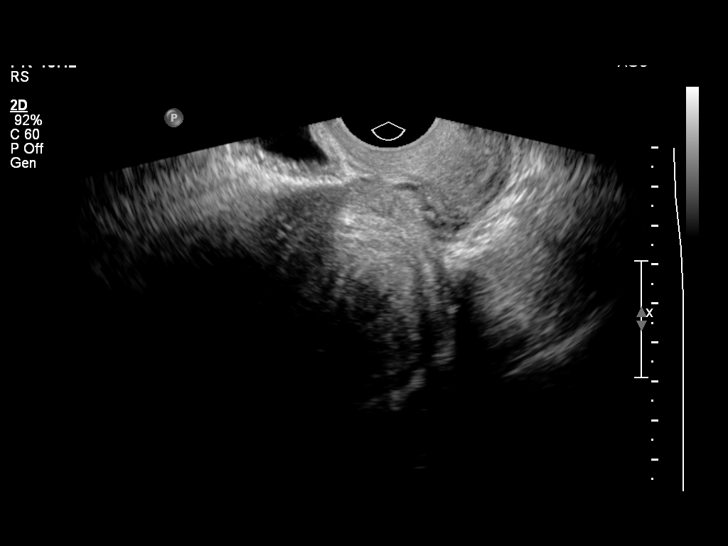
[im 38/52]
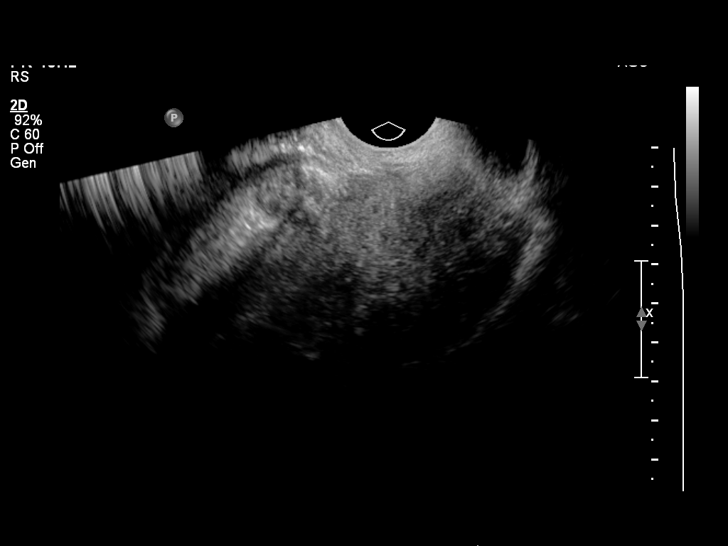
[im 42/52]
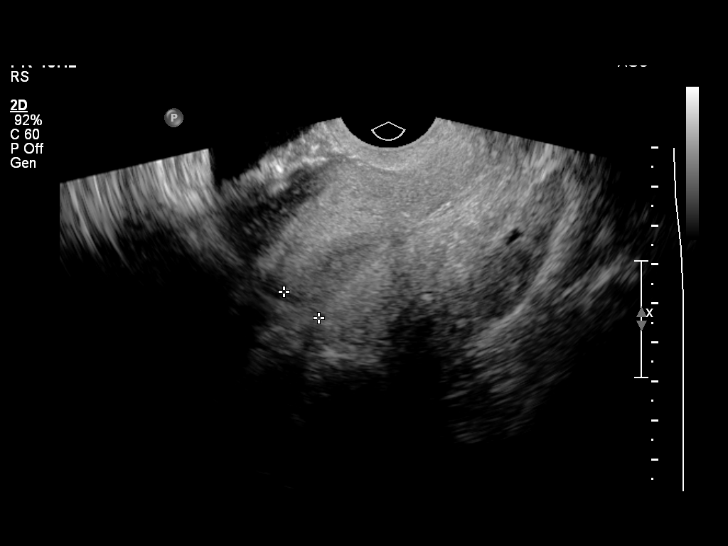
[im 46/52]
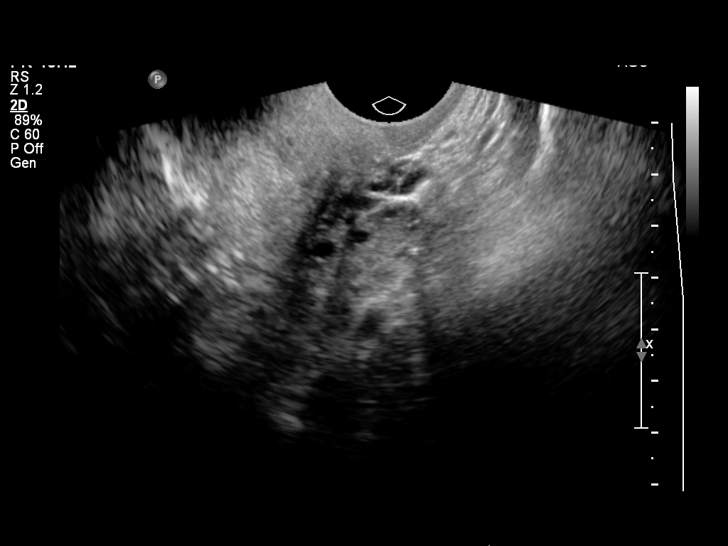
[im 50/52]
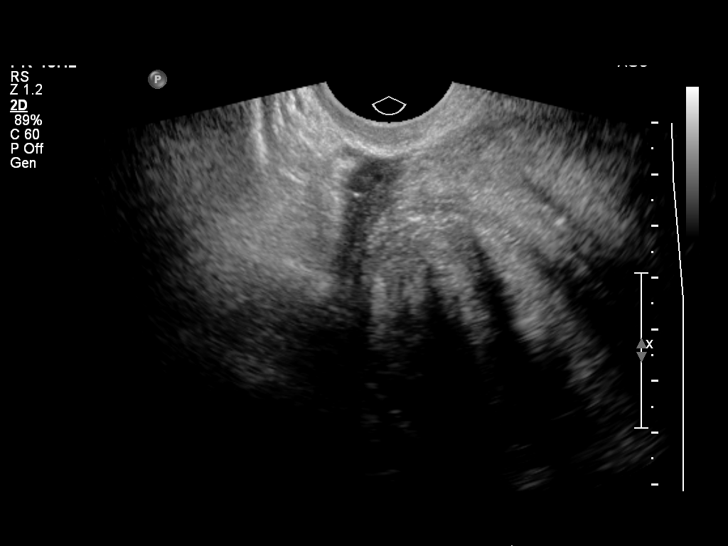

[13 of 28 positions shown; findings below may reference images not displayed]

FINDINGS: Intrauterine gestational sac: Not visualized

Maternal uterus/adnexae: Endometrial complex measures 11 mm.

Shadowing fibroids, including a dominant 5.9 x 6.0 x 4.4 cm
intramural posterior fundal fibroid.

Right ovary is not discretely visualized. Left ovary is within
normal limits.

No free fluid.
IMPRESSION: No IUP is visualized.  Endometrial complex measures 11 mm.

Left ovary is within normal limits. Shadowing fibroids, including a
dominant 6.0 cm intramural posterior fundal fibroid, which obscure
visualization of the right ovary/adnexa.

By definition, in the setting of a positive pregnancy test, this
reflects a pregnancy of unknown location. Differential
considerations include early normal IUP, abnormal IUP/missed
abortion, or nonvisualized ectopic pregnancy.

Serial beta HCG is suggested. Consider repeat pelvic ultrasound in
14 days (or earlier as clinically warranted).

These results were called by telephone at the time of interpretation
on 10/29/2014 at [DATE] to Dr. Medjouly, who verbally acknowledged
these results.

## 2017-06-19 DIAGNOSIS — E039 Hypothyroidism, unspecified: Secondary | ICD-10-CM | POA: Diagnosis not present

## 2017-06-19 DIAGNOSIS — N939 Abnormal uterine and vaginal bleeding, unspecified: Secondary | ICD-10-CM | POA: Diagnosis not present

## 2017-06-19 DIAGNOSIS — D259 Leiomyoma of uterus, unspecified: Secondary | ICD-10-CM | POA: Diagnosis not present

## 2017-06-19 DIAGNOSIS — E119 Type 2 diabetes mellitus without complications: Secondary | ICD-10-CM | POA: Diagnosis not present

## 2017-06-19 DIAGNOSIS — E669 Obesity, unspecified: Secondary | ICD-10-CM | POA: Diagnosis not present

## 2017-06-19 DIAGNOSIS — Z7984 Long term (current) use of oral hypoglycemic drugs: Secondary | ICD-10-CM | POA: Diagnosis not present

## 2017-06-19 DIAGNOSIS — G8918 Other acute postprocedural pain: Secondary | ICD-10-CM | POA: Diagnosis not present

## 2017-06-19 DIAGNOSIS — D252 Subserosal leiomyoma of uterus: Secondary | ICD-10-CM | POA: Diagnosis not present

## 2017-06-19 DIAGNOSIS — Z6841 Body Mass Index (BMI) 40.0 and over, adult: Secondary | ICD-10-CM | POA: Diagnosis not present

## 2017-06-19 DIAGNOSIS — N852 Hypertrophy of uterus: Secondary | ICD-10-CM | POA: Diagnosis not present

## 2017-06-19 DIAGNOSIS — D251 Intramural leiomyoma of uterus: Secondary | ICD-10-CM | POA: Diagnosis not present

## 2017-06-19 DIAGNOSIS — I10 Essential (primary) hypertension: Secondary | ICD-10-CM | POA: Diagnosis not present

## 2017-11-15 DIAGNOSIS — Z0000 Encounter for general adult medical examination without abnormal findings: Secondary | ICD-10-CM | POA: Diagnosis not present

## 2017-11-15 DIAGNOSIS — E039 Hypothyroidism, unspecified: Secondary | ICD-10-CM | POA: Diagnosis not present

## 2017-11-15 DIAGNOSIS — Z79899 Other long term (current) drug therapy: Secondary | ICD-10-CM | POA: Diagnosis not present

## 2017-11-15 DIAGNOSIS — E559 Vitamin D deficiency, unspecified: Secondary | ICD-10-CM | POA: Diagnosis not present

## 2017-11-15 DIAGNOSIS — E1165 Type 2 diabetes mellitus with hyperglycemia: Secondary | ICD-10-CM | POA: Diagnosis not present

## 2018-01-15 DIAGNOSIS — E039 Hypothyroidism, unspecified: Secondary | ICD-10-CM | POA: Diagnosis not present

## 2018-03-04 DIAGNOSIS — E039 Hypothyroidism, unspecified: Secondary | ICD-10-CM | POA: Diagnosis not present

## 2018-04-18 DIAGNOSIS — Z6841 Body Mass Index (BMI) 40.0 and over, adult: Secondary | ICD-10-CM | POA: Diagnosis not present

## 2018-04-18 DIAGNOSIS — B372 Candidiasis of skin and nail: Secondary | ICD-10-CM | POA: Diagnosis not present

## 2018-04-18 DIAGNOSIS — Z124 Encounter for screening for malignant neoplasm of cervix: Secondary | ICD-10-CM | POA: Diagnosis not present

## 2018-04-18 DIAGNOSIS — Z01411 Encounter for gynecological examination (general) (routine) with abnormal findings: Secondary | ICD-10-CM | POA: Diagnosis not present

## 2018-04-26 DIAGNOSIS — L732 Hidradenitis suppurativa: Secondary | ICD-10-CM | POA: Diagnosis not present

## 2018-07-05 DIAGNOSIS — J309 Allergic rhinitis, unspecified: Secondary | ICD-10-CM | POA: Diagnosis not present

## 2018-08-13 DIAGNOSIS — Z8759 Personal history of other complications of pregnancy, childbirth and the puerperium: Secondary | ICD-10-CM | POA: Diagnosis not present

## 2018-08-13 DIAGNOSIS — O209 Hemorrhage in early pregnancy, unspecified: Secondary | ICD-10-CM | POA: Diagnosis not present

## 2018-08-15 DIAGNOSIS — O209 Hemorrhage in early pregnancy, unspecified: Secondary | ICD-10-CM | POA: Diagnosis not present

## 2018-08-22 DIAGNOSIS — O209 Hemorrhage in early pregnancy, unspecified: Secondary | ICD-10-CM | POA: Diagnosis not present

## 2018-09-06 DIAGNOSIS — N96 Recurrent pregnancy loss: Secondary | ICD-10-CM | POA: Diagnosis not present

## 2018-12-31 DIAGNOSIS — E559 Vitamin D deficiency, unspecified: Secondary | ICD-10-CM | POA: Diagnosis not present

## 2018-12-31 DIAGNOSIS — Z0000 Encounter for general adult medical examination without abnormal findings: Secondary | ICD-10-CM | POA: Diagnosis not present

## 2018-12-31 DIAGNOSIS — E119 Type 2 diabetes mellitus without complications: Secondary | ICD-10-CM | POA: Diagnosis not present

## 2018-12-31 DIAGNOSIS — Z79899 Other long term (current) drug therapy: Secondary | ICD-10-CM | POA: Diagnosis not present

## 2018-12-31 DIAGNOSIS — I10 Essential (primary) hypertension: Secondary | ICD-10-CM | POA: Diagnosis not present

## 2019-01-28 DIAGNOSIS — Z1231 Encounter for screening mammogram for malignant neoplasm of breast: Secondary | ICD-10-CM | POA: Diagnosis not present

## 2019-02-24 DIAGNOSIS — R928 Other abnormal and inconclusive findings on diagnostic imaging of breast: Secondary | ICD-10-CM | POA: Diagnosis not present

## 2019-02-27 ENCOUNTER — Other Ambulatory Visit: Payer: Self-pay | Admitting: Obstetrics & Gynecology

## 2019-02-27 DIAGNOSIS — N6489 Other specified disorders of breast: Secondary | ICD-10-CM

## 2019-03-11 ENCOUNTER — Other Ambulatory Visit: Payer: Self-pay | Admitting: Obstetrics & Gynecology

## 2019-03-11 ENCOUNTER — Ambulatory Visit
Admission: RE | Admit: 2019-03-11 | Discharge: 2019-03-11 | Disposition: A | Discharge status: Home / Self Care | Payer: BLUE CROSS/BLUE SHIELD | Source: Ambulatory Visit | Attending: Obstetrics & Gynecology | Admitting: Obstetrics & Gynecology

## 2019-03-11 ENCOUNTER — Other Ambulatory Visit: Payer: Self-pay

## 2019-03-11 DIAGNOSIS — N6489 Other specified disorders of breast: Secondary | ICD-10-CM

## 2019-03-11 DIAGNOSIS — N6311 Unspecified lump in the right breast, upper outer quadrant: Secondary | ICD-10-CM | POA: Diagnosis not present

## 2019-03-11 DIAGNOSIS — R599 Enlarged lymph nodes, unspecified: Secondary | ICD-10-CM

## 2019-03-12 ENCOUNTER — Other Ambulatory Visit: Payer: BLUE CROSS/BLUE SHIELD

## 2019-03-24 ENCOUNTER — Ambulatory Visit
Admission: RE | Admit: 2019-03-24 | Discharge: 2019-03-24 | Disposition: A | Discharge status: Home / Self Care | Payer: BC Managed Care – PPO | Source: Ambulatory Visit | Attending: Obstetrics & Gynecology | Admitting: Obstetrics & Gynecology

## 2019-03-24 ENCOUNTER — Other Ambulatory Visit: Payer: Self-pay

## 2019-03-24 ENCOUNTER — Other Ambulatory Visit: Payer: Self-pay | Admitting: Obstetrics & Gynecology

## 2019-03-24 DIAGNOSIS — R590 Localized enlarged lymph nodes: Secondary | ICD-10-CM | POA: Diagnosis not present

## 2019-03-24 DIAGNOSIS — N6489 Other specified disorders of breast: Secondary | ICD-10-CM

## 2019-03-24 DIAGNOSIS — R599 Enlarged lymph nodes, unspecified: Secondary | ICD-10-CM

## 2019-03-24 DIAGNOSIS — N6011 Diffuse cystic mastopathy of right breast: Secondary | ICD-10-CM | POA: Diagnosis not present

## 2019-03-24 DIAGNOSIS — R928 Other abnormal and inconclusive findings on diagnostic imaging of breast: Secondary | ICD-10-CM | POA: Diagnosis not present

## 2019-06-03 DIAGNOSIS — Z6841 Body Mass Index (BMI) 40.0 and over, adult: Secondary | ICD-10-CM | POA: Diagnosis not present

## 2019-06-03 DIAGNOSIS — Z01419 Encounter for gynecological examination (general) (routine) without abnormal findings: Secondary | ICD-10-CM | POA: Diagnosis not present

## 2019-10-01 DIAGNOSIS — M545 Low back pain: Secondary | ICD-10-CM | POA: Diagnosis not present

## 2020-01-22 DIAGNOSIS — I10 Essential (primary) hypertension: Secondary | ICD-10-CM | POA: Diagnosis not present

## 2020-01-22 DIAGNOSIS — E119 Type 2 diabetes mellitus without complications: Secondary | ICD-10-CM | POA: Diagnosis not present

## 2020-01-22 DIAGNOSIS — Z23 Encounter for immunization: Secondary | ICD-10-CM | POA: Diagnosis not present

## 2020-01-22 DIAGNOSIS — E559 Vitamin D deficiency, unspecified: Secondary | ICD-10-CM | POA: Diagnosis not present

## 2020-01-22 DIAGNOSIS — Z79899 Other long term (current) drug therapy: Secondary | ICD-10-CM | POA: Diagnosis not present

## 2020-01-22 DIAGNOSIS — Z0000 Encounter for general adult medical examination without abnormal findings: Secondary | ICD-10-CM | POA: Diagnosis not present

## 2020-03-23 DIAGNOSIS — Z03818 Encounter for observation for suspected exposure to other biological agents ruled out: Secondary | ICD-10-CM | POA: Diagnosis not present

## 2020-03-30 DIAGNOSIS — Z113 Encounter for screening for infections with a predominantly sexual mode of transmission: Secondary | ICD-10-CM | POA: Diagnosis not present

## 2020-06-02 DIAGNOSIS — Z1231 Encounter for screening mammogram for malignant neoplasm of breast: Secondary | ICD-10-CM | POA: Diagnosis not present

## 2020-06-24 DIAGNOSIS — Z01419 Encounter for gynecological examination (general) (routine) without abnormal findings: Secondary | ICD-10-CM | POA: Diagnosis not present

## 2020-06-24 DIAGNOSIS — Z6841 Body Mass Index (BMI) 40.0 and over, adult: Secondary | ICD-10-CM | POA: Diagnosis not present

## 2020-07-26 DIAGNOSIS — Z6841 Body Mass Index (BMI) 40.0 and over, adult: Secondary | ICD-10-CM | POA: Diagnosis not present

## 2020-08-26 DIAGNOSIS — E669 Obesity, unspecified: Secondary | ICD-10-CM | POA: Diagnosis not present

## 2020-08-26 DIAGNOSIS — Z6841 Body Mass Index (BMI) 40.0 and over, adult: Secondary | ICD-10-CM | POA: Diagnosis not present

## 2020-09-06 DIAGNOSIS — D251 Intramural leiomyoma of uterus: Secondary | ICD-10-CM | POA: Diagnosis not present

## 2020-09-06 DIAGNOSIS — E288 Other ovarian dysfunction: Secondary | ICD-10-CM | POA: Diagnosis not present

## 2020-10-12 DIAGNOSIS — H019 Unspecified inflammation of eyelid: Secondary | ICD-10-CM | POA: Diagnosis not present

## 2020-10-28 DIAGNOSIS — D251 Intramural leiomyoma of uterus: Secondary | ICD-10-CM | POA: Diagnosis not present

## 2020-10-28 DIAGNOSIS — Z3202 Encounter for pregnancy test, result negative: Secondary | ICD-10-CM | POA: Diagnosis not present

## 2020-11-01 DIAGNOSIS — D251 Intramural leiomyoma of uterus: Secondary | ICD-10-CM | POA: Diagnosis not present

## 2020-11-02 DIAGNOSIS — R634 Abnormal weight loss: Secondary | ICD-10-CM | POA: Diagnosis not present

## 2020-11-02 DIAGNOSIS — Z0130 Encounter for examination of blood pressure without abnormal findings: Secondary | ICD-10-CM | POA: Diagnosis not present

## 2020-11-15 DIAGNOSIS — Z3169 Encounter for other general counseling and advice on procreation: Secondary | ICD-10-CM | POA: Diagnosis not present

## 2020-11-15 DIAGNOSIS — E669 Obesity, unspecified: Secondary | ICD-10-CM | POA: Diagnosis not present

## 2020-11-15 DIAGNOSIS — E119 Type 2 diabetes mellitus without complications: Secondary | ICD-10-CM | POA: Diagnosis not present

## 2020-11-15 DIAGNOSIS — I10 Essential (primary) hypertension: Secondary | ICD-10-CM | POA: Diagnosis not present

## 2020-11-24 DIAGNOSIS — I10 Essential (primary) hypertension: Secondary | ICD-10-CM | POA: Diagnosis not present

## 2020-12-13 DIAGNOSIS — Z3183 Encounter for assisted reproductive fertility procedure cycle: Secondary | ICD-10-CM | POA: Diagnosis not present

## 2021-01-04 DIAGNOSIS — E119 Type 2 diabetes mellitus without complications: Secondary | ICD-10-CM | POA: Diagnosis not present

## 2021-01-04 DIAGNOSIS — I10 Essential (primary) hypertension: Secondary | ICD-10-CM | POA: Diagnosis not present

## 2021-01-04 DIAGNOSIS — N978 Female infertility of other origin: Secondary | ICD-10-CM | POA: Diagnosis not present

## 2021-01-04 DIAGNOSIS — E039 Hypothyroidism, unspecified: Secondary | ICD-10-CM | POA: Diagnosis not present

## 2021-01-21 DIAGNOSIS — E119 Type 2 diabetes mellitus without complications: Secondary | ICD-10-CM | POA: Diagnosis not present

## 2021-01-21 DIAGNOSIS — I10 Essential (primary) hypertension: Secondary | ICD-10-CM | POA: Diagnosis not present

## 2021-01-21 DIAGNOSIS — Z3169 Encounter for other general counseling and advice on procreation: Secondary | ICD-10-CM | POA: Diagnosis not present

## 2021-01-21 DIAGNOSIS — E039 Hypothyroidism, unspecified: Secondary | ICD-10-CM | POA: Diagnosis not present

## 2021-02-01 DIAGNOSIS — I10 Essential (primary) hypertension: Secondary | ICD-10-CM | POA: Diagnosis not present

## 2021-02-01 DIAGNOSIS — E119 Type 2 diabetes mellitus without complications: Secondary | ICD-10-CM | POA: Diagnosis not present

## 2021-02-01 DIAGNOSIS — Z3169 Encounter for other general counseling and advice on procreation: Secondary | ICD-10-CM | POA: Diagnosis not present

## 2021-02-01 DIAGNOSIS — N978 Female infertility of other origin: Secondary | ICD-10-CM | POA: Diagnosis not present

## 2021-02-16 DIAGNOSIS — E039 Hypothyroidism, unspecified: Secondary | ICD-10-CM | POA: Diagnosis not present

## 2021-02-16 DIAGNOSIS — Z23 Encounter for immunization: Secondary | ICD-10-CM | POA: Diagnosis not present

## 2021-02-16 DIAGNOSIS — E119 Type 2 diabetes mellitus without complications: Secondary | ICD-10-CM | POA: Diagnosis not present

## 2021-02-16 DIAGNOSIS — Z7984 Long term (current) use of oral hypoglycemic drugs: Secondary | ICD-10-CM | POA: Diagnosis not present

## 2021-02-16 DIAGNOSIS — Z79899 Other long term (current) drug therapy: Secondary | ICD-10-CM | POA: Diagnosis not present

## 2021-02-16 DIAGNOSIS — E559 Vitamin D deficiency, unspecified: Secondary | ICD-10-CM | POA: Diagnosis not present

## 2021-02-16 DIAGNOSIS — Z0000 Encounter for general adult medical examination without abnormal findings: Secondary | ICD-10-CM | POA: Diagnosis not present

## 2021-04-19 DIAGNOSIS — D251 Intramural leiomyoma of uterus: Secondary | ICD-10-CM | POA: Diagnosis not present

## 2021-04-19 DIAGNOSIS — E288 Other ovarian dysfunction: Secondary | ICD-10-CM | POA: Diagnosis not present

## 2021-04-19 DIAGNOSIS — Z113 Encounter for screening for infections with a predominantly sexual mode of transmission: Secondary | ICD-10-CM | POA: Diagnosis not present

## 2021-04-19 DIAGNOSIS — Z3141 Encounter for fertility testing: Secondary | ICD-10-CM | POA: Diagnosis not present

## 2021-04-22 DIAGNOSIS — E119 Type 2 diabetes mellitus without complications: Secondary | ICD-10-CM | POA: Diagnosis not present

## 2021-04-22 DIAGNOSIS — N978 Female infertility of other origin: Secondary | ICD-10-CM | POA: Diagnosis not present

## 2021-04-22 DIAGNOSIS — I10 Essential (primary) hypertension: Secondary | ICD-10-CM | POA: Diagnosis not present

## 2021-04-22 DIAGNOSIS — Z3169 Encounter for other general counseling and advice on procreation: Secondary | ICD-10-CM | POA: Diagnosis not present

## 2021-04-22 DIAGNOSIS — D251 Intramural leiomyoma of uterus: Secondary | ICD-10-CM | POA: Diagnosis not present

## 2021-05-04 DIAGNOSIS — Z3183 Encounter for assisted reproductive fertility procedure cycle: Secondary | ICD-10-CM | POA: Diagnosis not present

## 2021-05-06 DIAGNOSIS — D251 Intramural leiomyoma of uterus: Secondary | ICD-10-CM | POA: Diagnosis not present

## 2021-05-06 DIAGNOSIS — Z3183 Encounter for assisted reproductive fertility procedure cycle: Secondary | ICD-10-CM | POA: Diagnosis not present

## 2021-05-10 DIAGNOSIS — Z3183 Encounter for assisted reproductive fertility procedure cycle: Secondary | ICD-10-CM | POA: Diagnosis not present

## 2021-05-10 DIAGNOSIS — D251 Intramural leiomyoma of uterus: Secondary | ICD-10-CM | POA: Diagnosis not present

## 2021-05-13 DIAGNOSIS — Z3183 Encounter for assisted reproductive fertility procedure cycle: Secondary | ICD-10-CM | POA: Diagnosis not present

## 2021-05-16 DIAGNOSIS — Z3183 Encounter for assisted reproductive fertility procedure cycle: Secondary | ICD-10-CM | POA: Diagnosis not present

## 2021-05-16 DIAGNOSIS — N979 Female infertility, unspecified: Secondary | ICD-10-CM | POA: Diagnosis not present

## 2021-05-17 DIAGNOSIS — Z3183 Encounter for assisted reproductive fertility procedure cycle: Secondary | ICD-10-CM | POA: Diagnosis not present

## 2021-05-18 DIAGNOSIS — Z3183 Encounter for assisted reproductive fertility procedure cycle: Secondary | ICD-10-CM | POA: Diagnosis not present

## 2021-05-18 DIAGNOSIS — N979 Female infertility, unspecified: Secondary | ICD-10-CM | POA: Diagnosis not present

## 2021-05-19 DIAGNOSIS — Z3183 Encounter for assisted reproductive fertility procedure cycle: Secondary | ICD-10-CM | POA: Diagnosis not present

## 2021-05-19 DIAGNOSIS — N979 Female infertility, unspecified: Secondary | ICD-10-CM | POA: Diagnosis not present

## 2021-05-21 DIAGNOSIS — Z3183 Encounter for assisted reproductive fertility procedure cycle: Secondary | ICD-10-CM | POA: Diagnosis not present

## 2021-05-21 DIAGNOSIS — Z3141 Encounter for fertility testing: Secondary | ICD-10-CM | POA: Diagnosis not present

## 2021-05-26 DIAGNOSIS — Z3183 Encounter for assisted reproductive fertility procedure cycle: Secondary | ICD-10-CM | POA: Diagnosis not present

## 2021-08-31 ENCOUNTER — Other Ambulatory Visit: Payer: Self-pay | Admitting: Family Medicine

## 2021-08-31 DIAGNOSIS — I10 Essential (primary) hypertension: Secondary | ICD-10-CM

## 2021-09-06 DIAGNOSIS — I10 Essential (primary) hypertension: Secondary | ICD-10-CM | POA: Diagnosis not present

## 2021-09-09 ENCOUNTER — Other Ambulatory Visit: Payer: BC Managed Care – PPO

## 2021-09-09 ENCOUNTER — Ambulatory Visit
Admission: RE | Admit: 2021-09-09 | Discharge: 2021-09-09 | Disposition: A | Discharge status: Home / Self Care | Payer: BC Managed Care – PPO | Source: Ambulatory Visit | Attending: Family Medicine | Admitting: Family Medicine

## 2021-09-09 DIAGNOSIS — I10 Essential (primary) hypertension: Secondary | ICD-10-CM | POA: Diagnosis not present

## 2021-09-16 DIAGNOSIS — Z3183 Encounter for assisted reproductive fertility procedure cycle: Secondary | ICD-10-CM | POA: Diagnosis not present

## 2021-09-16 DIAGNOSIS — N979 Female infertility, unspecified: Secondary | ICD-10-CM | POA: Diagnosis not present

## 2021-09-16 DIAGNOSIS — Z31430 Encounter of female for testing for genetic disease carrier status for procreative management: Secondary | ICD-10-CM | POA: Diagnosis not present

## 2021-11-01 IMAGING — US US  BREAST BX W/ LOC DEV 1ST LESION IMG BX SPEC US GUIDE*R*
1 series · 16 of 25 positions shown · non-contrast
Comparison: Previous exam(s).
COMPARISON: Previous exam(s).

Addendum:
CLINICAL DATA: Focal asymmetry in the upper outer right breast on a
recent baseline screening mammogram with corresponding indistinct
hyperechoic tissue in the 10 o'clock position at ultrasound.
Enlarged right inferior axillary lymph nodes at ultrasound. History
of hidradenitis in the past.

EXAM:
ULTRASOUND GUIDED RIGHT BREAST CORE NEEDLE BIOPSY
ULTRASOUND GUIDED RIGHT AXILLARY LYMPH NODE BIOPSY

[Series 1: us breast bx w/ loc dev 1st lesion img bx spec us  · 0.07mm/px · 16 of 28 slices shown]
[im 1/28]
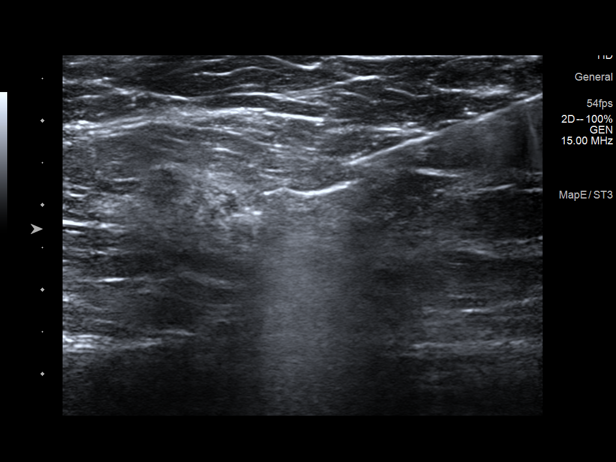
[im 3/28]
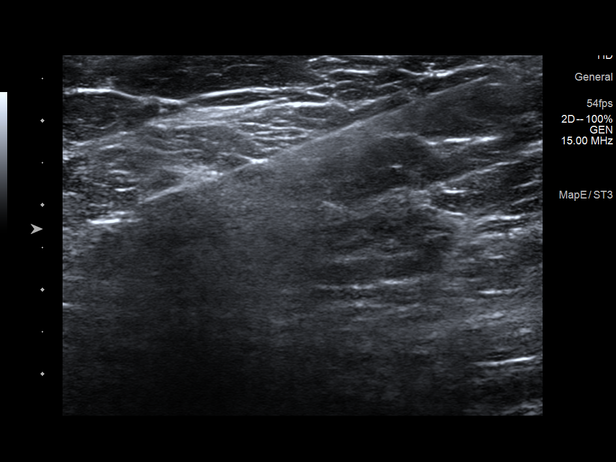
[im 4/28]
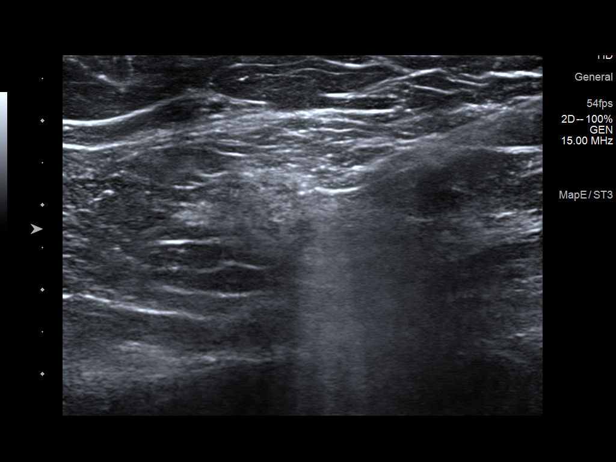
[im 6/28]
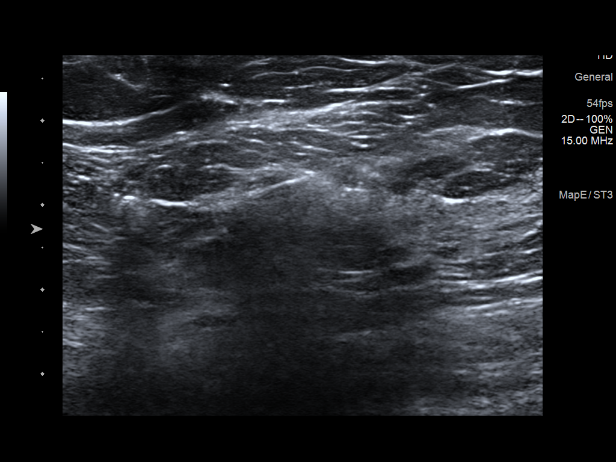
[im 8/28]
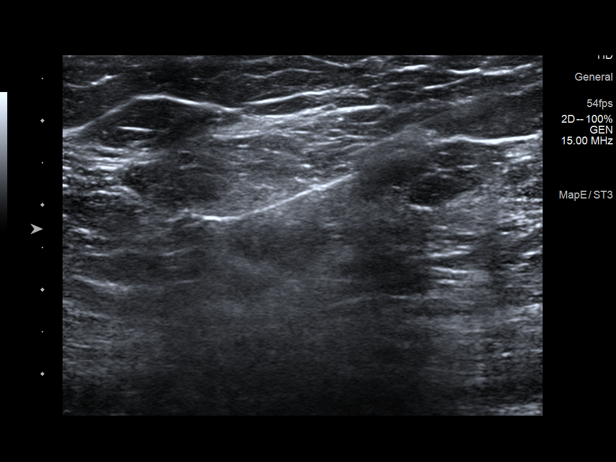
[im 10/28]
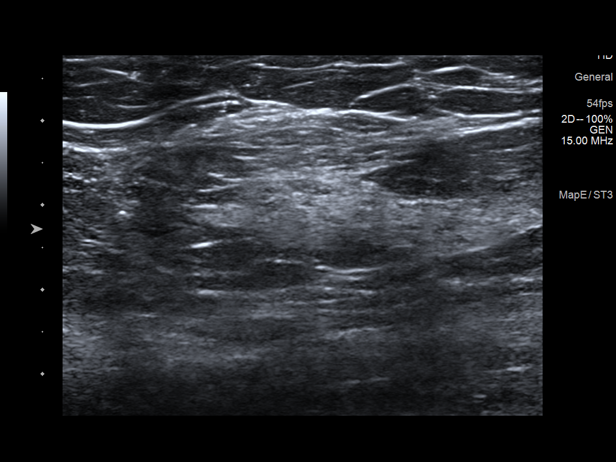
[im 12/28]
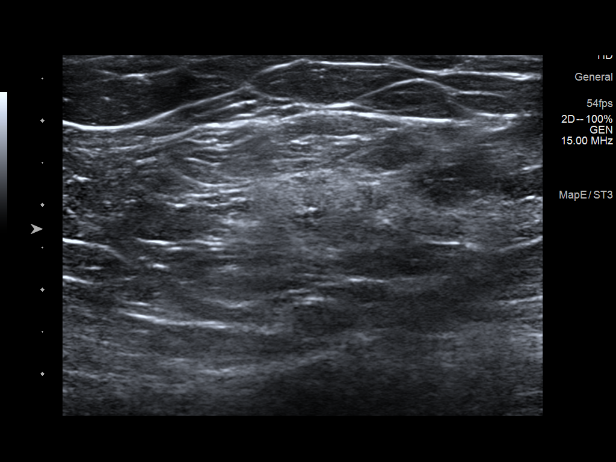
[im 13/28]
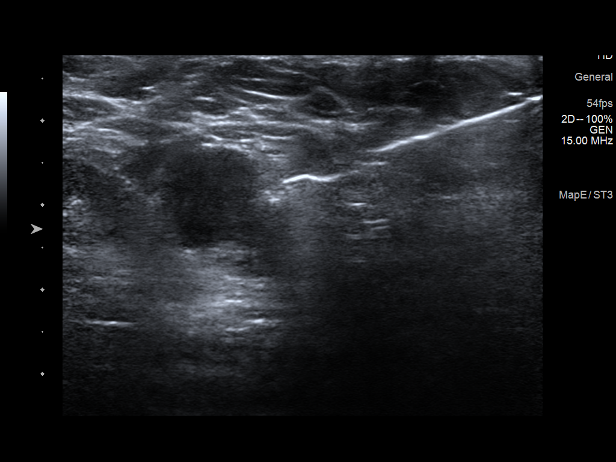
[im 15/28]
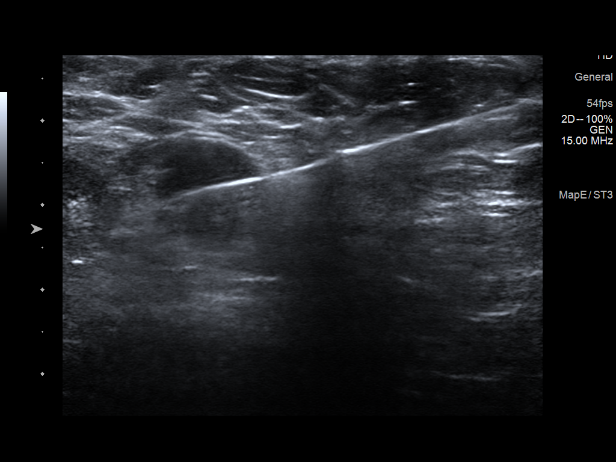
[im 16/28]
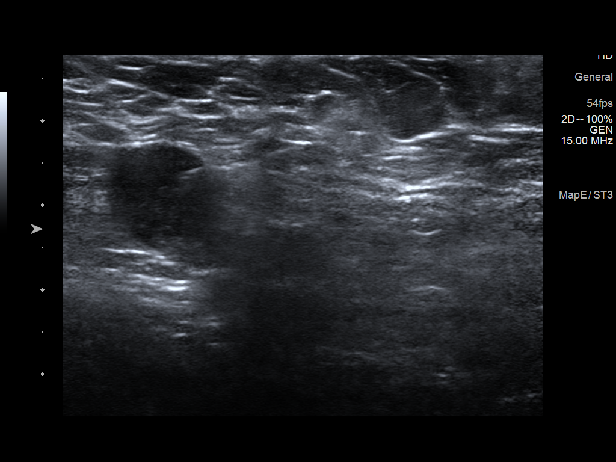
[im 19/28]
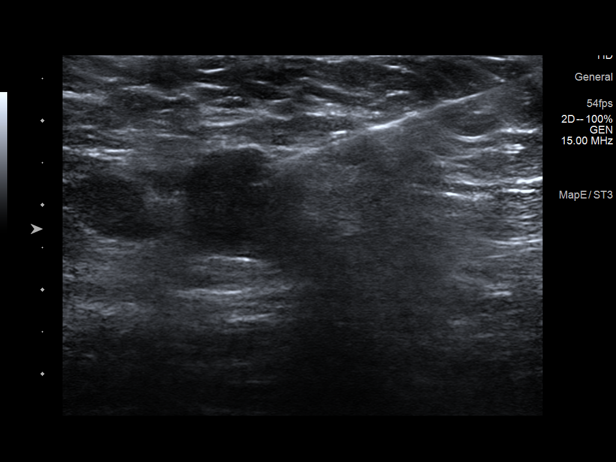
[im 20/28]
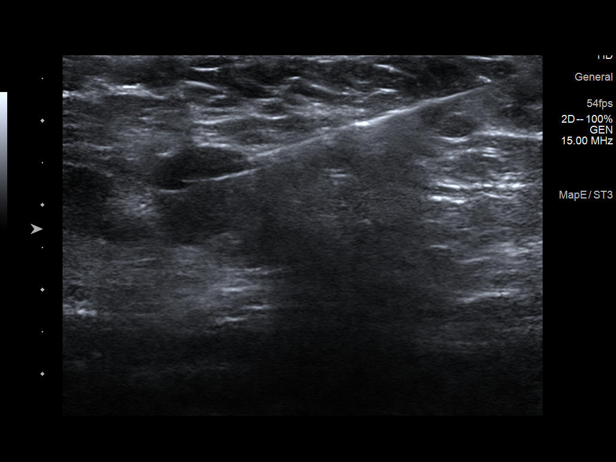
[im 22/28]
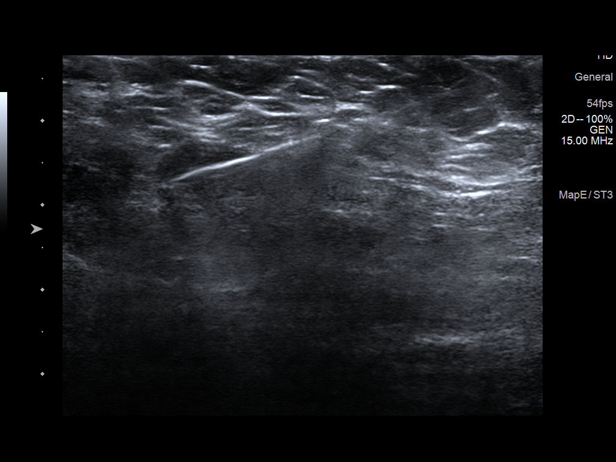
[im 24/28]
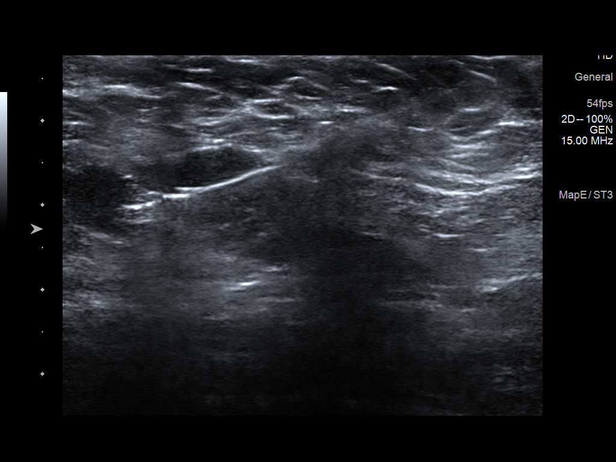
[im 25/28]
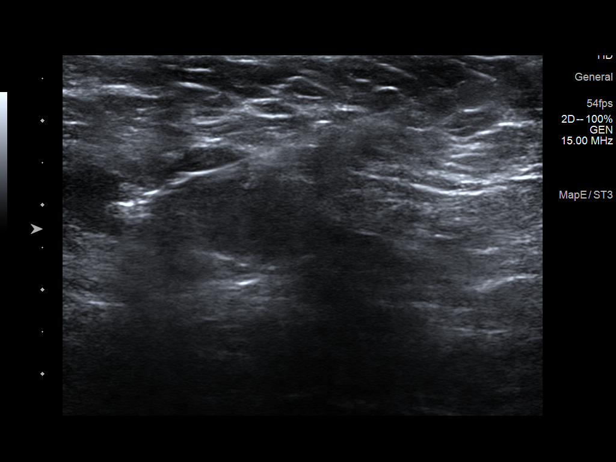
[im 28/28]
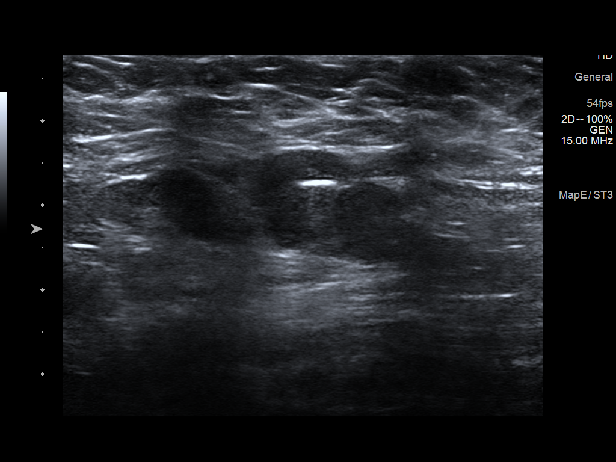

[16 of 25 positions shown; findings below may reference images not displayed]

FINDINGS: I met with the patient and we discussed the procedure of
ultrasound-guided biopsy, including benefits and alternatives. We
discussed the high likelihood of a successful procedures. We
discussed the risks of the procedures, including infection,
bleeding, tissue injury, clip migration, and inadequate sampling.
Informed written consent was given. The usual time-out protocol was
performed immediately prior to the procedures.

SITE #1: FOCAL ECHOGENIC TISSUE 10 O'CLOCK RIGHT BREAST

Lesion quadrant: Upper outer quadrant

Using sterile technique and 1% Lidocaine as local anesthetic, under
direct ultrasound visualization, a 12 gauge De Sousa device was
used to perform biopsy of the focal echogenic tissue in the 10
o'clock position of the right breast using a inferolateral approach.
At the conclusion of the procedure ribbon shaped tissue marker clip
was deployed into the biopsy cavity. Follow up 2 view mammogram was
performed and dictated separately.

SITE #2: ENLARGED RIGHT INFERIOR AXILLARY LYMPH NODE

Using sterile technique and 1% Lidocaine as local anesthetic, under
direct ultrasound visualization, a 12 gauge De Sousa bodies was
used to perform biopsy of the largest right inferior axillary lymph
node using a caudal approach. At the conclusion of the procedure
spiral shaped HydroMARK tissue marker clip was deployed into the
biopsy cavity. Follow up 2 view mammogram was performed and dictated
separately.
IMPRESSION: Ultrasound guided biopsies of the recently demonstrated focal
echogenic tissue in the 10 o'clock position of the right breast and
the largest enlarged right inferior axillary lymph node. No apparent
complications.

ADDENDUM:
Pathology revealed FIBROCYSTIC CHANGE WITH CALCIFICATIONS of the
RIGHT breast hyperechoic tissue, 10 o'clock. This was found to be
concordant by Dr. Jarvis Jim.

Pathology revealed REACTIVE LYMPH NODE WITH DERMATOPATHIC CHANGES
AND FOLLICULAR HYPERPLASIA of the RIGHT axilla. This was found to be
concordant by Dr. Jarvis Jim.

Pathology results were discussed with the patient by telephone. The
patient reported doing well after the biopsies with tenderness at
the sites. Post biopsy instructions and care were reviewed and
questions were answered. The patient was encouraged to call The

The patient was instructed to return for annual screening
mammography due December 2019 at [facility] and informed
a reminder notice would be sent regarding this appointment.

Pathology results reported by Andika Budi Sioe, RN on 03/25/2019.

*** End of Addendum ***
FINDINGS: I met with the patient and we discussed the procedure of
ultrasound-guided biopsy, including benefits and alternatives. We
discussed the high likelihood of a successful procedures. We
discussed the risks of the procedures, including infection,
bleeding, tissue injury, clip migration, and inadequate sampling.
Informed written consent was given. The usual time-out protocol was
performed immediately prior to the procedures.

SITE #1: FOCAL ECHOGENIC TISSUE 10 O'CLOCK RIGHT BREAST

Lesion quadrant: Upper outer quadrant

Using sterile technique and 1% Lidocaine as local anesthetic, under
direct ultrasound visualization, a 12 gauge De Sousa device was
used to perform biopsy of the focal echogenic tissue in the 10
o'clock position of the right breast using a inferolateral approach.
At the conclusion of the procedure ribbon shaped tissue marker clip
was deployed into the biopsy cavity. Follow up 2 view mammogram was
performed and dictated separately.

SITE #2: ENLARGED RIGHT INFERIOR AXILLARY LYMPH NODE

Using sterile technique and 1% Lidocaine as local anesthetic, under
direct ultrasound visualization, a 12 gauge De Sousa bodies was
used to perform biopsy of the largest right inferior axillary lymph
node using a caudal approach. At the conclusion of the procedure
spiral shaped HydroMARK tissue marker clip was deployed into the
biopsy cavity. Follow up 2 view mammogram was performed and dictated
separately.
IMPRESSION: Ultrasound guided biopsies of the recently demonstrated focal
echogenic tissue in the 10 o'clock position of the right breast and
the largest enlarged right inferior axillary lymph node. No apparent
complications.

## 2021-11-01 IMAGING — MG MM BREAST LOCALIZATION CLIP
4 series · 4 of 12 positions shown · non-contrast
Comparison: Previous exam(s).

CLINICAL DATA: Status post ultrasound-guided core needle biopsy a
focal area of echogenic tissue in the 10 o'clock position of the
right breast and an enlarged right inferior axillary lymph node.

EXAM:
DIAGNOSTIC RIGHT MAMMOGRAM POST ULTRASOUND BIOPSY X 2

[R ML synth-2D]
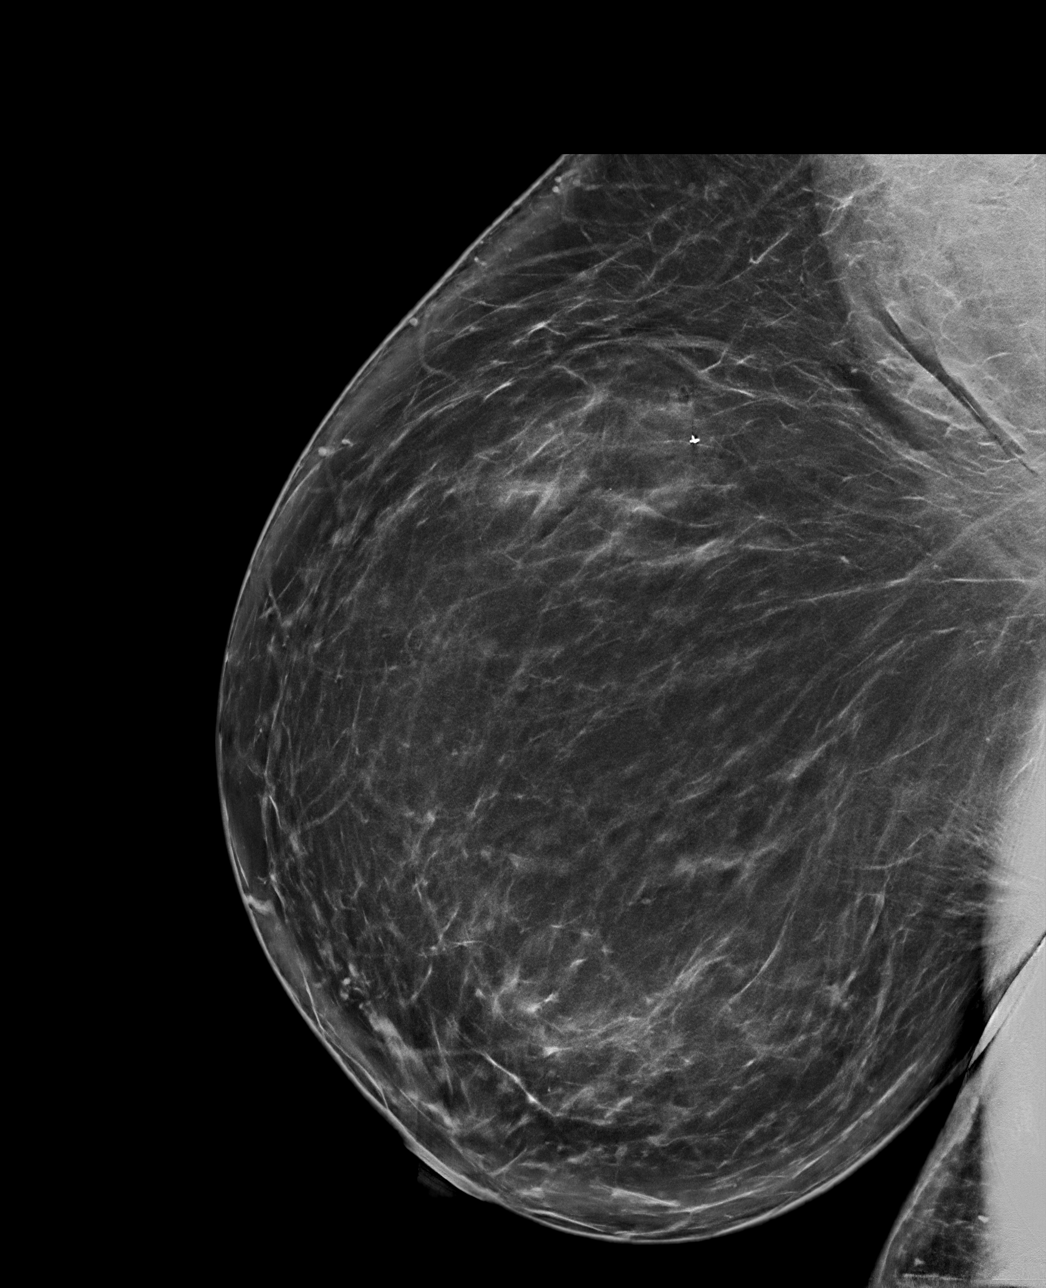

[R CC synth-2D]
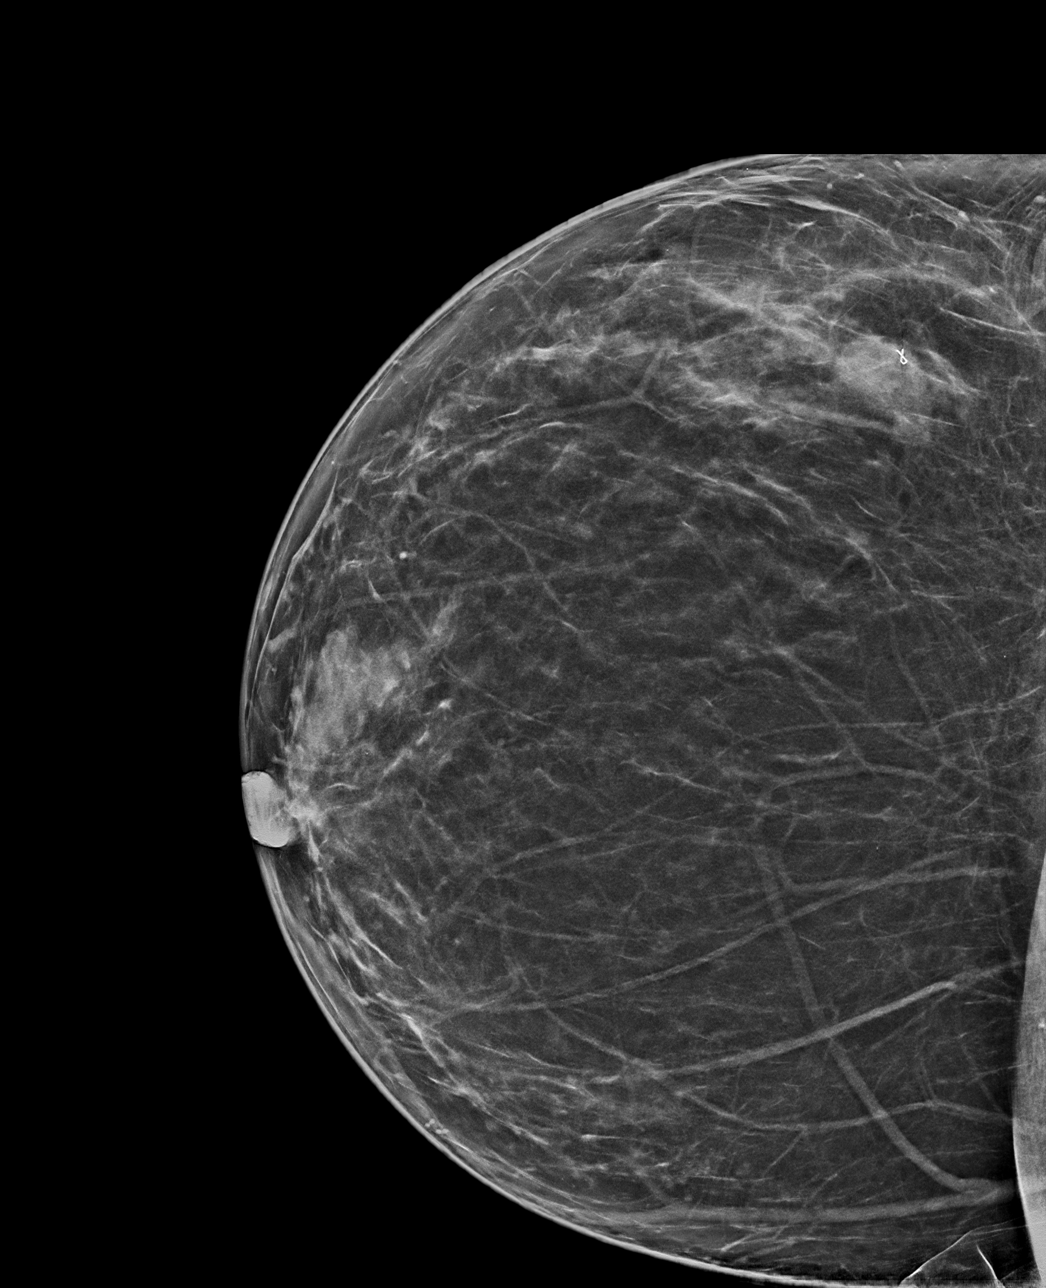

[R ML tomo · tomo slice 51/100.0]
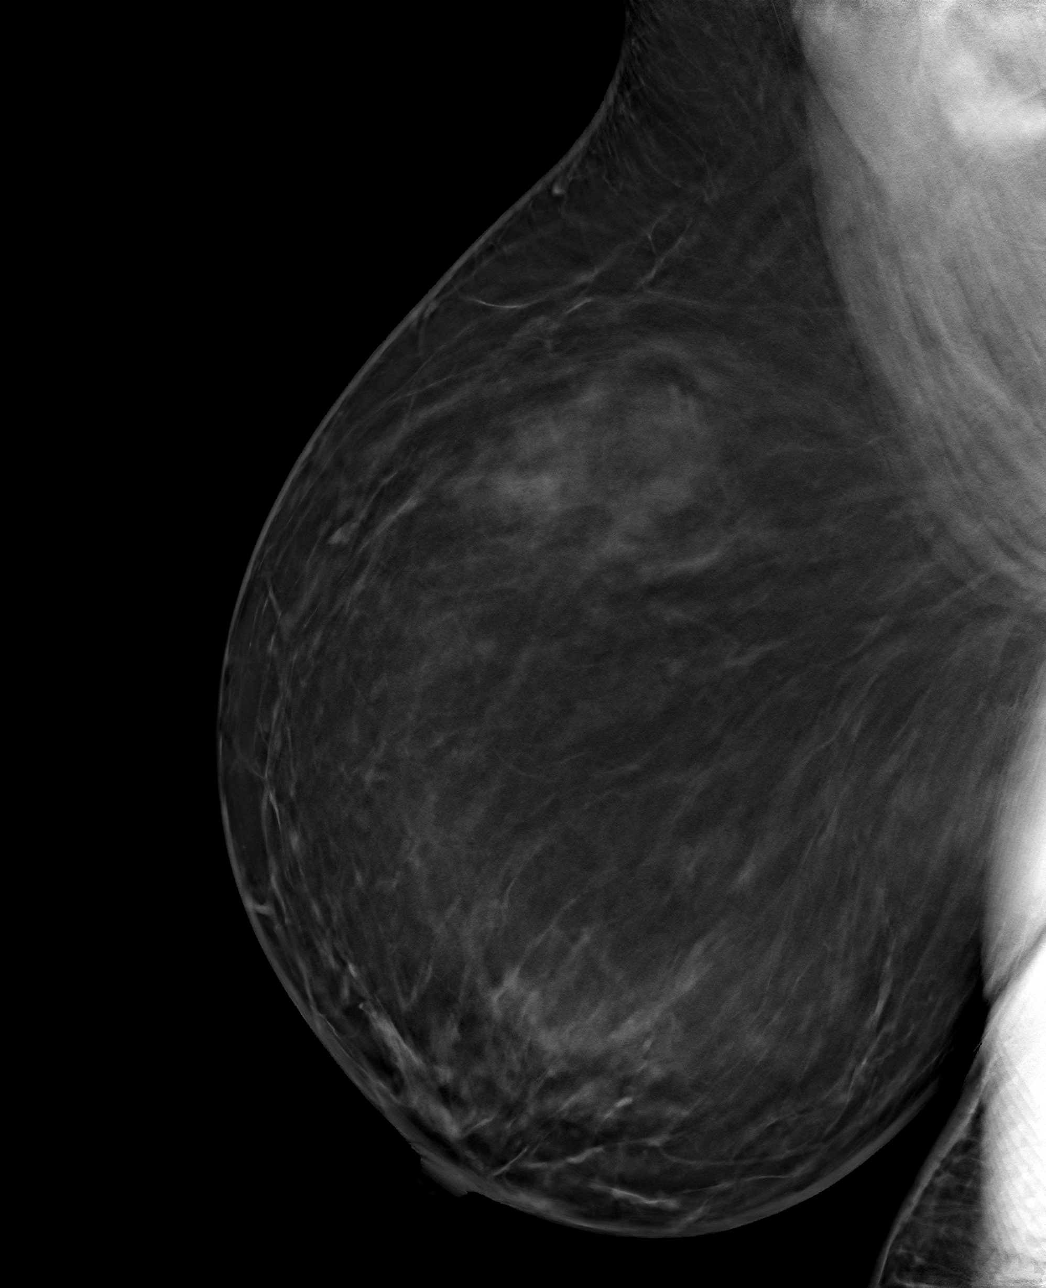

[R CC tomo · tomo slice 42/83.0]
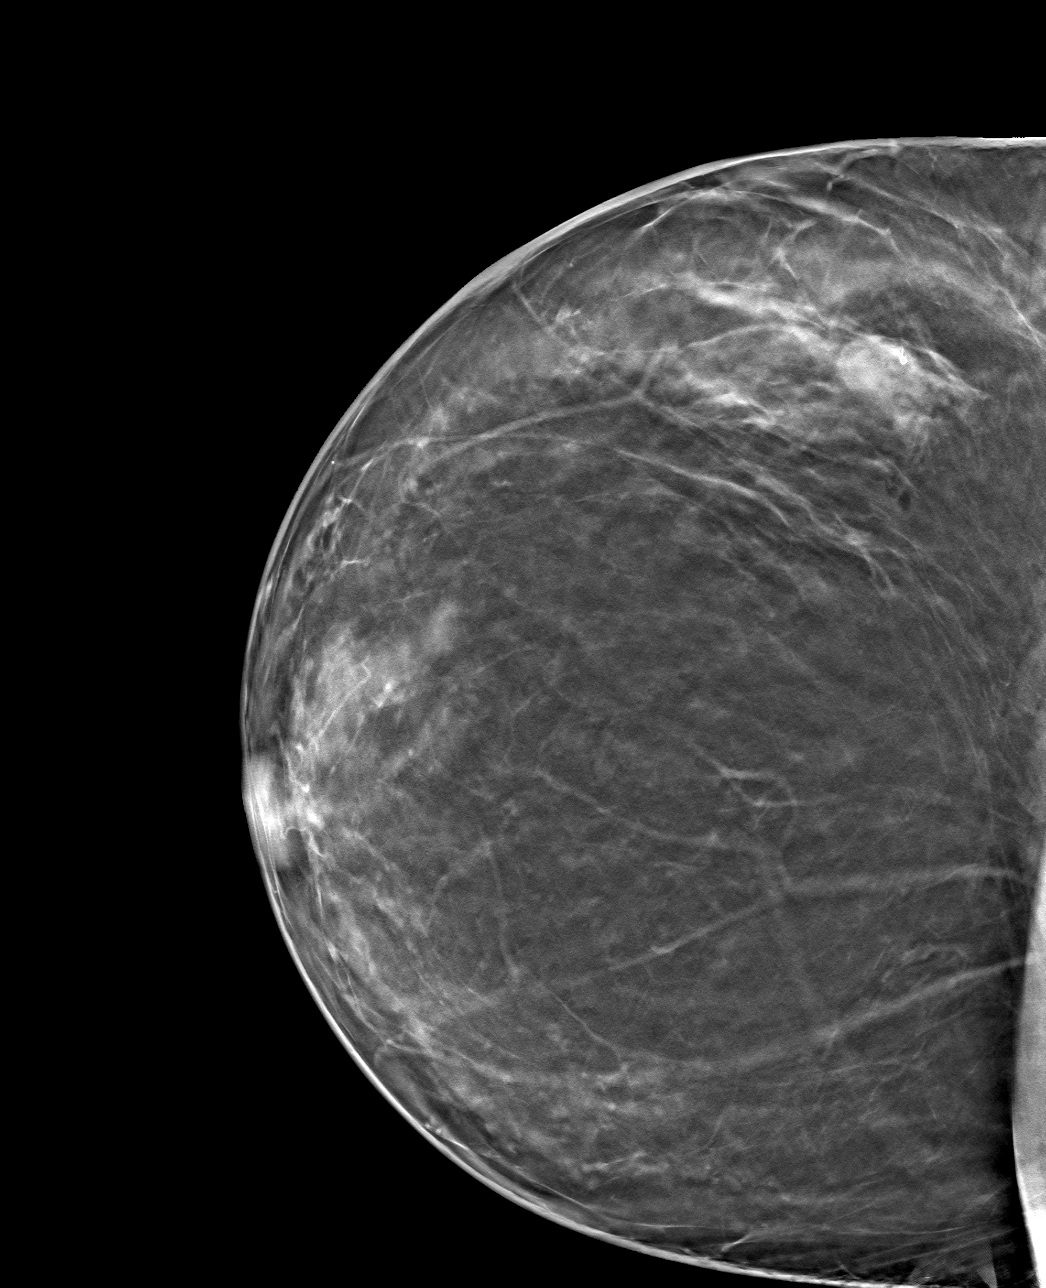

[4 of 12 positions shown; findings below may reference images not displayed]

FINDINGS: Mammographic images were obtained following ultrasound guided biopsy
of the recently demonstrated focal echogenic tissue in the 10
o'clock position of the right breast and the largest abnormal
inferior right axillary lymph node. The biopsy marking clips are in
expected position at the biopsy sites.
IMPRESSION: Appropriate positioning of the ribbon shaped shaped biopsy marking
clip at the site of biopsy in the focal asymmetry in the 10 o'clock
position of the right breast and spiral shaped biopsy marker clip in
the biopsied abnormal inferior right axillary lymph.

Final Assessment: Post Procedure Mammograms for Marker Placement

## 2022-02-02 ENCOUNTER — Telehealth: Payer: Self-pay | Admitting: *Deleted

## 2022-02-02 NOTE — Patient Outreach (Signed)
  Care Coordination   02/02/2022 Name: Roberta Bell MRN: [id number] DOB: November 26, 1980   Care Coordination Outreach Attempts:  An unsuccessful telephone outreach was attempted today to offer the patient information about available care coordination services as a benefit of their health plan.   Follow Up Plan:  Additional outreach attempts will be made to offer the patient care coordination information and services.   Encounter Outcome:  No Answer  Care Coordination Interventions Activated:  No   Care Coordination Interventions:  No, not indicated    Elliot Cousin, RN Care Management Coordinator [facility] [employer] Main Office [phone]

## 2022-03-07 DIAGNOSIS — Z79899 Other long term (current) drug therapy: Secondary | ICD-10-CM | POA: Diagnosis not present

## 2022-03-07 DIAGNOSIS — E559 Vitamin D deficiency, unspecified: Secondary | ICD-10-CM | POA: Diagnosis not present

## 2022-03-07 DIAGNOSIS — I10 Essential (primary) hypertension: Secondary | ICD-10-CM | POA: Diagnosis not present

## 2022-03-07 DIAGNOSIS — E039 Hypothyroidism, unspecified: Secondary | ICD-10-CM | POA: Diagnosis not present

## 2022-03-07 DIAGNOSIS — E119 Type 2 diabetes mellitus without complications: Secondary | ICD-10-CM | POA: Diagnosis not present

## 2022-03-07 DIAGNOSIS — Z0000 Encounter for general adult medical examination without abnormal findings: Secondary | ICD-10-CM | POA: Diagnosis not present

## 2022-06-05 DIAGNOSIS — Z1231 Encounter for screening mammogram for malignant neoplasm of breast: Secondary | ICD-10-CM | POA: Diagnosis not present

## 2022-06-05 DIAGNOSIS — L732 Hidradenitis suppurativa: Secondary | ICD-10-CM | POA: Diagnosis not present

## 2022-06-05 DIAGNOSIS — Z01411 Encounter for gynecological examination (general) (routine) with abnormal findings: Secondary | ICD-10-CM | POA: Diagnosis not present

## 2022-06-05 DIAGNOSIS — Z79899 Other long term (current) drug therapy: Secondary | ICD-10-CM | POA: Diagnosis not present

## 2022-06-05 DIAGNOSIS — E119 Type 2 diabetes mellitus without complications: Secondary | ICD-10-CM | POA: Diagnosis not present

## 2022-06-05 DIAGNOSIS — N926 Irregular menstruation, unspecified: Secondary | ICD-10-CM | POA: Diagnosis not present

## 2022-06-05 DIAGNOSIS — B372 Candidiasis of skin and nail: Secondary | ICD-10-CM | POA: Diagnosis not present

## 2022-06-15 DIAGNOSIS — H00022 Hordeolum internum right lower eyelid: Secondary | ICD-10-CM | POA: Diagnosis not present

## 2022-11-13 DIAGNOSIS — U071 COVID-19: Secondary | ICD-10-CM | POA: Diagnosis not present

## 2023-03-06 DIAGNOSIS — E559 Vitamin D deficiency, unspecified: Secondary | ICD-10-CM | POA: Diagnosis not present

## 2023-03-06 DIAGNOSIS — E119 Type 2 diabetes mellitus without complications: Secondary | ICD-10-CM | POA: Diagnosis not present

## 2023-03-06 DIAGNOSIS — E039 Hypothyroidism, unspecified: Secondary | ICD-10-CM | POA: Diagnosis not present

## 2023-03-06 DIAGNOSIS — Z79899 Other long term (current) drug therapy: Secondary | ICD-10-CM | POA: Diagnosis not present

## 2023-03-06 DIAGNOSIS — I10 Essential (primary) hypertension: Secondary | ICD-10-CM | POA: Diagnosis not present

## 2023-03-08 DIAGNOSIS — Z0000 Encounter for general adult medical examination without abnormal findings: Secondary | ICD-10-CM | POA: Diagnosis not present

## 2023-03-08 DIAGNOSIS — Z23 Encounter for immunization: Secondary | ICD-10-CM | POA: Diagnosis not present

## 2023-06-06 DIAGNOSIS — E7800 Pure hypercholesterolemia, unspecified: Secondary | ICD-10-CM | POA: Diagnosis not present

## 2023-06-06 DIAGNOSIS — Z1211 Encounter for screening for malignant neoplasm of colon: Secondary | ICD-10-CM | POA: Diagnosis not present

## 2023-06-06 DIAGNOSIS — Z6841 Body Mass Index (BMI) 40.0 and over, adult: Secondary | ICD-10-CM | POA: Diagnosis not present

## 2023-06-06 DIAGNOSIS — E1165 Type 2 diabetes mellitus with hyperglycemia: Secondary | ICD-10-CM | POA: Diagnosis not present

## 2023-06-19 DIAGNOSIS — Z1231 Encounter for screening mammogram for malignant neoplasm of breast: Secondary | ICD-10-CM | POA: Diagnosis not present

## 2023-06-19 DIAGNOSIS — Z124 Encounter for screening for malignant neoplasm of cervix: Secondary | ICD-10-CM | POA: Diagnosis not present

## 2023-06-19 DIAGNOSIS — Z01419 Encounter for gynecological examination (general) (routine) without abnormal findings: Secondary | ICD-10-CM | POA: Diagnosis not present

## 2023-06-25 ENCOUNTER — Other Ambulatory Visit: Payer: Self-pay | Admitting: Obstetrics and Gynecology

## 2023-06-25 DIAGNOSIS — R928 Other abnormal and inconclusive findings on diagnostic imaging of breast: Secondary | ICD-10-CM

## 2023-07-11 ENCOUNTER — Ambulatory Visit
Admission: RE | Admit: 2023-07-11 | Discharge: 2023-07-11 | Disposition: A | Discharge status: Home / Self Care | Source: Ambulatory Visit | Attending: Obstetrics and Gynecology | Admitting: Obstetrics and Gynecology

## 2023-07-11 ENCOUNTER — Other Ambulatory Visit: Payer: Self-pay | Admitting: Obstetrics and Gynecology

## 2023-07-11 DIAGNOSIS — R928 Other abnormal and inconclusive findings on diagnostic imaging of breast: Secondary | ICD-10-CM

## 2023-07-11 DIAGNOSIS — R590 Localized enlarged lymph nodes: Secondary | ICD-10-CM | POA: Diagnosis not present

## 2023-07-11 DIAGNOSIS — N6323 Unspecified lump in the left breast, lower outer quadrant: Secondary | ICD-10-CM | POA: Diagnosis not present

## 2023-07-11 DIAGNOSIS — R921 Mammographic calcification found on diagnostic imaging of breast: Secondary | ICD-10-CM

## 2023-07-13 ENCOUNTER — Ambulatory Visit
Admission: RE | Admit: 2023-07-13 | Discharge: 2023-07-13 | Disposition: A | Discharge status: Home / Self Care | Source: Ambulatory Visit | Attending: Obstetrics and Gynecology | Admitting: Obstetrics and Gynecology

## 2023-07-13 DIAGNOSIS — R928 Other abnormal and inconclusive findings on diagnostic imaging of breast: Secondary | ICD-10-CM

## 2023-07-13 DIAGNOSIS — R921 Mammographic calcification found on diagnostic imaging of breast: Secondary | ICD-10-CM

## 2023-07-13 DIAGNOSIS — N6323 Unspecified lump in the left breast, lower outer quadrant: Secondary | ICD-10-CM | POA: Diagnosis not present

## 2023-07-13 DIAGNOSIS — N6489 Other specified disorders of breast: Secondary | ICD-10-CM | POA: Diagnosis not present

## 2023-07-13 DIAGNOSIS — R920 Mammographic microcalcification found on diagnostic imaging of breast: Secondary | ICD-10-CM | POA: Diagnosis not present

## 2023-07-13 HISTORY — PX: BREAST BIOPSY: SHX20

## 2023-07-16 LAB — SURGICAL PATHOLOGY

## 2023-12-13 DIAGNOSIS — E039 Hypothyroidism, unspecified: Secondary | ICD-10-CM | POA: Diagnosis not present

## 2023-12-13 DIAGNOSIS — I10 Essential (primary) hypertension: Secondary | ICD-10-CM | POA: Diagnosis not present

## 2023-12-13 DIAGNOSIS — Z794 Long term (current) use of insulin: Secondary | ICD-10-CM | POA: Diagnosis not present

## 2023-12-13 DIAGNOSIS — E559 Vitamin D deficiency, unspecified: Secondary | ICD-10-CM | POA: Diagnosis not present

## 2023-12-13 DIAGNOSIS — Z1331 Encounter for screening for depression: Secondary | ICD-10-CM | POA: Diagnosis not present

## 2023-12-13 DIAGNOSIS — E119 Type 2 diabetes mellitus without complications: Secondary | ICD-10-CM | POA: Diagnosis not present

## 2023-12-13 DIAGNOSIS — R0602 Shortness of breath: Secondary | ICD-10-CM | POA: Diagnosis not present

## 2023-12-27 DIAGNOSIS — Q181 Preauricular sinus and cyst: Secondary | ICD-10-CM | POA: Diagnosis not present

## 2024-01-17 DIAGNOSIS — E1165 Type 2 diabetes mellitus with hyperglycemia: Secondary | ICD-10-CM | POA: Diagnosis not present

## 2024-01-21 DIAGNOSIS — Z1211 Encounter for screening for malignant neoplasm of colon: Secondary | ICD-10-CM | POA: Diagnosis not present

## 2024-01-21 DIAGNOSIS — D122 Benign neoplasm of ascending colon: Secondary | ICD-10-CM | POA: Diagnosis not present

## 2024-03-17 DIAGNOSIS — E1165 Type 2 diabetes mellitus with hyperglycemia: Secondary | ICD-10-CM | POA: Diagnosis not present

## 2024-03-17 DIAGNOSIS — E559 Vitamin D deficiency, unspecified: Secondary | ICD-10-CM | POA: Diagnosis not present

## 2024-03-17 DIAGNOSIS — I10 Essential (primary) hypertension: Secondary | ICD-10-CM | POA: Diagnosis not present

## 2024-03-17 DIAGNOSIS — E039 Hypothyroidism, unspecified: Secondary | ICD-10-CM | POA: Diagnosis not present

## 2024-03-18 DIAGNOSIS — E1169 Type 2 diabetes mellitus with other specified complication: Secondary | ICD-10-CM | POA: Diagnosis not present

## 2024-03-18 DIAGNOSIS — L732 Hidradenitis suppurativa: Secondary | ICD-10-CM | POA: Diagnosis not present

## 2024-03-18 DIAGNOSIS — E119 Type 2 diabetes mellitus without complications: Secondary | ICD-10-CM | POA: Diagnosis not present

## 2024-03-18 DIAGNOSIS — Z0000 Encounter for general adult medical examination without abnormal findings: Secondary | ICD-10-CM | POA: Diagnosis not present

## 2024-03-18 DIAGNOSIS — L659 Nonscarring hair loss, unspecified: Secondary | ICD-10-CM | POA: Diagnosis not present

## 2024-03-18 DIAGNOSIS — E785 Hyperlipidemia, unspecified: Secondary | ICD-10-CM | POA: Diagnosis not present
